# Patient Record
Sex: Male | Born: 1960 | Race: White | Hispanic: No | Smoking: Never smoker
Health system: Southern US, Community
[De-identification: ages and names within clinical notes are randomized; demographics above are authoritative.]

## PROBLEM LIST (undated history)

## (undated) DIAGNOSIS — K635 Polyp of colon: Secondary | ICD-10-CM

## (undated) DIAGNOSIS — I10 Essential (primary) hypertension: Secondary | ICD-10-CM

## (undated) HISTORY — DX: Polyp of colon: K63.5

---

## 2004-04-03 ENCOUNTER — Ambulatory Visit (HOSPITAL_COMMUNITY): Admission: RE | Admit: 2004-04-03 | Discharge: 2004-04-03 | Payer: Self-pay | Admitting: Gastroenterology

## 2004-06-20 ENCOUNTER — Encounter: Admission: RE | Admit: 2004-06-20 | Discharge: 2004-06-20 | Payer: Self-pay | Admitting: Family Medicine

## 2012-10-12 ENCOUNTER — Other Ambulatory Visit: Payer: Self-pay | Admitting: Gastroenterology

## 2015-01-22 ENCOUNTER — Other Ambulatory Visit (HOSPITAL_COMMUNITY): Payer: Self-pay

## 2015-01-22 ENCOUNTER — Ambulatory Visit
Admission: RE | Admit: 2015-01-22 | Discharge: 2015-01-22 | Disposition: A | Discharge status: Home / Self Care | Payer: BC Managed Care – PPO | Source: Ambulatory Visit | Attending: Physician Assistant | Admitting: Physician Assistant

## 2015-01-22 ENCOUNTER — Other Ambulatory Visit: Payer: Self-pay | Admitting: Physician Assistant

## 2015-01-22 DIAGNOSIS — R0789 Other chest pain: Secondary | ICD-10-CM

## 2015-01-22 DIAGNOSIS — R0602 Shortness of breath: Secondary | ICD-10-CM

## 2015-02-05 ENCOUNTER — Encounter: Payer: Self-pay | Admitting: Cardiology

## 2015-02-05 ENCOUNTER — Ambulatory Visit (INDEPENDENT_AMBULATORY_CARE_PROVIDER_SITE_OTHER): Payer: BC Managed Care – PPO | Admitting: Cardiology

## 2015-02-05 VITALS — BP 120/78 | HR 73 | Ht 70.0 in | Wt 228.0 lb

## 2015-02-05 DIAGNOSIS — R079 Chest pain, unspecified: Secondary | ICD-10-CM | POA: Insufficient documentation

## 2015-02-05 DIAGNOSIS — R0600 Dyspnea, unspecified: Secondary | ICD-10-CM

## 2015-02-05 DIAGNOSIS — R0789 Other chest pain: Secondary | ICD-10-CM

## 2015-02-05 DIAGNOSIS — R072 Precordial pain: Secondary | ICD-10-CM

## 2015-02-05 NOTE — Patient Instructions (Addendum)
Your physician recommends that you schedule a follow-up appointment in: as needed with Dr. Antoine PocheHochrein  We will make you an appt. With the vascular screening dept. over at our [address]  Location for vascular screening  We have put in for a ct calcium score at that same location

## 2015-02-05 NOTE — Progress Notes (Signed)
Cardiology Office Note   Date:  02/05/2015   ID:  Stanley Mahoney, DOB 02/20/1961, MRN [id number]  PCP:  No primary care provider on file.  Cardiologist:   Rollene Rotunda, MD   Chief Complaint  Patient presents with  . Shortness of Breath  . Chest Pain      History of Present Illness: Stanley Mahoney is a 54 y.o. male who presents for evaluation of chest pain and dyspnea. He has no past cardiac history. However, he reports that for a couple of months he's getting shortness of breath with activities such as walking up 5 flights of stairs. He says he can make it up the stairs but he will get short of breath at the top although he does recover. He does not have any shortness of breath at rest. He does occasionally get some chest discomfort. This is a mid chest pressure. It is somewhat reproducible when he exercises occasionally happens sporadically at rest. He does not get associated symptoms. He does not get radiation to his neck or to his arm.  He has had some shoulder discomfort but he thinks it's related to playing tennis recently.  He has not had any palpitations, presyncope or syncope. He denies any PND or orthopnea. He has had some soreness in his left thigh. He denies any weight gain or edema. He did have a treadmill test at [facility].  He says he went 10 minutes and was told it was normal although I don't have these results. He did have a BNP recently which was normal.   Past Medical History  Diagnosis Date  . Colon polyps     No past surgical history on file.   Current Outpatient Prescriptions  Medication Sig Dispense Refill  . diphenhydrAMINE (BENADRYL) 25 mg capsule Take 25 mg by mouth every 8 (eight) hours as needed.    Marland Kitchen EPIPEN 2-PAK 0.3 MG/0.3ML SOAJ injection See admin instructions. for allergic reaction  0  . esomeprazole (NEXIUM) 20 MG capsule Take 20 mg by mouth daily at 12 noon.    . fluticasone (FLONASE) 50 MCG/ACT nasal spray Place 1 spray into both  nostrils daily.     No current facility-administered medications for this visit.    Allergies:   Review of patient's allergies indicates not on file.    Social History:  The patient  reports that he has never smoked. He does not have any smokeless tobacco history on file.   Family History:  The patient's family history includes Aortic aneurysm in his maternal grandfather; CAD in an other family member; Colon cancer in his maternal grandmother; Heart disease in his maternal grandfather.    ROS:  Please see the history of present illness.   Otherwise, review of systems are positive for none.   All other systems are reviewed and negative.    PHYSICAL EXAM: VS:  BP 120/78 mmHg  Pulse 73  Ht  (1.778 m)  Wt 228 lb (103.42 kg)  BMI 32.71 kg/m2 , BMI Body mass index is 32.71 kg/(m^2). GENERAL:  Well appearing HEENT:  Pupils equal round and reactive, fundi not visualized, oral mucosa unremarkable NECK:  No jugular venous distention, waveform within normal limits, carotid upstroke brisk and symmetric, no bruits, no thyromegaly LYMPHATICS:  No cervical, inguinal adenopathy LUNGS:  Clear to auscultation bilaterally BACK:  No CVA tenderness CHEST:  Unremarkable HEART:  PMI not displaced or sustained,S1 and S2 within normal limits, no S3, no S4, no clicks,  no rubs, no murmurs ABD:  Flat, positive bowel sounds normal in frequency in pitch, no bruits, no rebound, no guarding, no midline pulsatile mass, no hepatomegaly, no splenomegaly EXT:  2 plus pulses throughout, no edema, no cyanosis no clubbing SKIN:  No rashes no nodules NEURO:  Cranial nerves II through XII grossly intact, motor grossly intact throughout PSYCH:  Cognitively intact, oriented to person place and time    EKG:  EKG is ordered today. The ekg ordered today demonstrates sinus rhythm, rate 73, axis within normal limits, short PR interval, no acute ST-T wave changes.      Wt Readings from Last 3 Encounters:  02/05/15  228 lb (103.42 kg)      Other studies Reviewed: Additional studies/ records that were reviewed today include: Office records. Review of the above records demonstrates:  Please see elsewhere in the note.     ASSESSMENT AND PLAN:  DYSPNEA:  I don't suspect an ischemic etiology. I think is very reasonable to do a coronary calcium score for risk stratification and on the outside possibility that he has a high score which would necessitate further ischemia evaluation.  He and I talked about this at length.   I think ordering pulmonary function tests would be reasonable. I will defer to Dr Clelia CroftShaw for this.  We then discussed the possibility of deconditioning. If he begins to exercise more routinely gets more short of breath rather than less I would want to reassess then possibly to exclude something like exercise-induced asthma. At that point cardiopulmonary stress testing might be more revealing.  ABNORMAL EKG:  He does have a short PR interval but I don't believe slight slurring in his QRS represents a delta wave. If there is ever any hint of dysrhythmia we could pursue an event monitor but he is not giving this history.    Current medicines are reviewed at length with the patient today.  The patient does not have concerns regarding medicines.  The following changes have been made:  no change  Labs/ tests ordered today include:  Orders Placed This Encounter  Procedures  . CT Cardiac Scoring  . EKG 12-Lead     Disposition:   FU with me as needed    Signed, Rollene RotundaJames Frutoso Dimare, MD  02/05/2015 6:12 PM     [facility]

## 2015-02-07 ENCOUNTER — Other Ambulatory Visit (HOSPITAL_COMMUNITY): Payer: Self-pay | Admitting: Radiology

## 2015-02-07 ENCOUNTER — Encounter: Payer: Self-pay | Admitting: Cardiology

## 2015-02-07 DIAGNOSIS — R0609 Other forms of dyspnea: Principal | ICD-10-CM

## 2015-02-08 ENCOUNTER — Ambulatory Visit (INDEPENDENT_AMBULATORY_CARE_PROVIDER_SITE_OTHER)
Admission: RE | Admit: 2015-02-08 | Discharge: 2015-02-08 | Disposition: A | Discharge status: Home / Self Care | Payer: BC Managed Care – PPO | Source: Ambulatory Visit | Attending: Cardiology | Admitting: Cardiology

## 2015-02-08 DIAGNOSIS — R0789 Other chest pain: Secondary | ICD-10-CM

## 2015-02-15 ENCOUNTER — Ambulatory Visit (HOSPITAL_COMMUNITY)
Admission: RE | Admit: 2015-02-15 | Discharge: 2015-02-15 | Disposition: A | Discharge status: Home / Self Care | Payer: BC Managed Care – PPO | Source: Ambulatory Visit | Attending: Internal Medicine | Admitting: Internal Medicine

## 2015-02-15 DIAGNOSIS — R0609 Other forms of dyspnea: Secondary | ICD-10-CM | POA: Diagnosis not present

## 2015-02-15 MED ORDER — ALBUTEROL SULFATE (2.5 MG/3ML) 0.083% IN NEBU
2.5000 mg | INHALATION_SOLUTION | Freq: Once | RESPIRATORY_TRACT | Status: AC
  Administered 2015-02-15: 2.5 mg via RESPIRATORY_TRACT

## 2015-02-17 LAB — PULMONARY FUNCTION TEST
DL/VA % PRED: 105 %
DL/VA: 4.88 ml/min/mmHg/L
DLCO UNC: 37.26 ml/min/mmHg
DLCO unc % pred: 115 %
FEF 25-75 POST: 4.24 L/s
FEF 25-75 Pre: 3.43 L/sec
FEF2575-%CHANGE-POST: 23 %
FEF2575-%PRED-PRE: 102 %
FEF2575-%Pred-Post: 126 %
FEV1-%CHANGE-POST: 3 %
FEV1-%PRED-POST: 108 %
FEV1-%PRED-PRE: 105 %
FEV1-POST: 4.19 L
FEV1-Pre: 4.06 L
FEV1FVC-%CHANGE-POST: 4 %
FEV1FVC-%Pred-Pre: 100 %
FEV6-%Change-Post: 0 %
FEV6-%PRED-PRE: 108 %
FEV6-%Pred-Post: 107 %
FEV6-PRE: 5.23 L
FEV6-Post: 5.18 L
FEV6FVC-%Change-Post: 0 %
FEV6FVC-%PRED-POST: 103 %
FEV6FVC-%Pred-Pre: 103 %
FVC-%Change-Post: -1 %
FVC-%PRED-POST: 103 %
FVC-%Pred-Pre: 105 %
FVC-PRE: 5.25 L
FVC-Post: 5.19 L
POST FEV1/FVC RATIO: 81 %
POST FEV6/FVC RATIO: 100 %
PRE FEV6/FVC RATIO: 100 %
Pre FEV1/FVC ratio: 77 %
RV % PRED: 116 %
RV: 2.43 L
TLC % PRED: 114 %
TLC: 7.99 L

## 2015-02-18 ENCOUNTER — Telehealth: Payer: Self-pay | Admitting: Cardiology

## 2015-02-18 ENCOUNTER — Encounter: Payer: Self-pay | Admitting: Cardiology

## 2015-02-18 ENCOUNTER — Other Ambulatory Visit: Payer: Self-pay | Admitting: *Deleted

## 2015-02-18 DIAGNOSIS — R931 Abnormal findings on diagnostic imaging of heart and coronary circulation: Secondary | ICD-10-CM

## 2015-02-18 NOTE — Telephone Encounter (Signed)
Pt is returning JC's call in reference to a test he had done. Please call back  thanks

## 2015-03-19 ENCOUNTER — Encounter (HOSPITAL_COMMUNITY): Payer: BC Managed Care – PPO

## 2015-05-17 ENCOUNTER — Encounter: Payer: Self-pay | Admitting: Cardiology

## 2015-05-17 ENCOUNTER — Ambulatory Visit (INDEPENDENT_AMBULATORY_CARE_PROVIDER_SITE_OTHER): Payer: BC Managed Care – PPO | Admitting: Cardiology

## 2015-05-17 VITALS — BP 128/90 | HR 71 | Ht 70.0 in | Wt 227.0 lb

## 2015-05-17 DIAGNOSIS — R0600 Dyspnea, unspecified: Secondary | ICD-10-CM

## 2015-05-17 NOTE — Patient Instructions (Addendum)
Your physician wants you to follow-up in: 1 Year You will receive a reminder letter in the mail two months in advance. If you don't receive a letter, please call our office to schedule the follow-up appointment.  Your physician has requested that you have an exercise tolerance test. For further information please visit [email or web address]. Please also follow instruction sheet, as given. 1 Year   

## 2015-05-17 NOTE — Progress Notes (Signed)
Cardiology Office Note   Date:  05/17/2015   ID:  Stanley Mahoney, DOB 01-20-1961, MRN [id number]  PCP:  [name], MD  Cardiologist:   Rollene Rotunda, MD   Chief Complaint  Patient presents with  . Follow-up    Patient has no complaints.      History of Present Illness: Stanley Mahoney is a 54 y.o. male who presents for evaluation of chest pain and dyspnea. This is described in the previous note.  This happens with extreme exertion. I did send him for a calcium score which was at the 70 percentile for his age but still very low at 81. Since then he's had no further cardiovascular symptoms.  The patient denies any new symptoms such as chest discomfort, neck or arm discomfort. There has been no new shortness of breath, PND or orthopnea. There have been no reported palpitations, presyncope or syncope.  He exercises routinely. He did have a negative exercise treadmill test in February and I was able to review this.    Past Medical History  Diagnosis Date  . Colon polyps     History reviewed. No pertinent past surgical history.   Current Outpatient Prescriptions  Medication Sig Dispense Refill  . Cetirizine HCl (ZYRTEC ALLERGY PO) Take by mouth as needed.    . diphenhydrAMINE (BENADRYL) 25 mg capsule Take 25 mg by mouth every 8 (eight) hours as needed.    Marland Kitchen EPIPEN 2-PAK 0.3 MG/0.3ML SOAJ injection See admin instructions. for allergic reaction  0  . esomeprazole (NEXIUM) 20 MG capsule Take 20 mg by mouth daily at 12 noon.    . fluticasone (FLONASE) 50 MCG/ACT nasal spray Place 1 spray into both nostrils daily.     No current facility-administered medications for this visit.    Allergies:   Review of patient's allergies indicates no known allergies.    ROS:  Please see the history of present illness.   Otherwise, review of systems are positive for none.   All other systems are reviewed and negative.    PHYSICAL EXAM: VS:  BP 128/90 mmHg  Pulse 71  Ht  (1.778 m)   Wt 227 lb (102.967 kg)  BMI 32.57 kg/m2 , BMI Body mass index is 32.57 kg/(m^2). GENERAL:  Well appearing NECK:  No jugular venous distention, waveform within normal limits, carotid upstroke brisk and symmetric, no bruits, no thyromegaly LUNGS:  Clear to auscultation bilaterally BACK:  No CVA tenderness CHEST:  Unremarkable HEART:  PMI not displaced or sustained,S1 and S2 within normal limits, no S3, no S4, no clicks, no rubs, no murmurs ABD:  Flat, positive bowel sounds normal in frequency in pitch, no bruits, no rebound, no guarding, no midline pulsatile mass, no hepatomegaly, no splenomegaly EXT:  2 plus pulses throughout, no edema, no cyanosis no clubbing   EKG:  EKG is not ordered today.     Wt Readings from Last 3 Encounters:  05/17/15 227 lb (102.967 kg)  02/05/15 228 lb (103.42 kg)      Other studies Reviewed: Additional studies/ records that were reviewed today include: Office records and a treadmill from [place].  I looked at his lipids.  Review of the above records demonstrates:  Please see elsewhere in the note.     ASSESSMENT AND PLAN:  CORONARY CALCIUM:   This calcium score with him at a more moderate brain 74th percentile for age but it was still low. I was able to review the stress test he had  at [facility]. He actually went for 11 minutes on his treadmill without any EKG changes. At the very peak gets shortness of breath and some mild discomfort he thinks that this was because he was breathing hard.  He has no other high-risk findings. At this point the possibility of obstructive coronary disease and even his risk stratification is low. I don't think further cardiovascular testing is suggested and we discussed this at length. He should have continued primary risk reduction and I will repeat a  POET (Plain Old Exercise Treadmill) in 1 year.  DYSLIPIDEMIA:    The patient will be sending his results to me and I will discuss further lipid management.   Current  medicines are reviewed at length with the patient today.  The patient does not have concerns regarding medicines.  The following changes have been made:  no change   Disposition:   FU with with a POET (Plain Old Exercise Treadmill) in one year.    Signed, Rollene Rotunda, MD  05/17/2015 8:53 AM    [facility]

## 2015-08-14 ENCOUNTER — Encounter: Payer: Self-pay | Admitting: Cardiology

## 2015-08-23 ENCOUNTER — Other Ambulatory Visit: Payer: Self-pay | Admitting: *Deleted

## 2015-08-23 DIAGNOSIS — I2510 Atherosclerotic heart disease of native coronary artery without angina pectoris: Secondary | ICD-10-CM

## 2015-08-23 DIAGNOSIS — R931 Abnormal findings on diagnostic imaging of heart and coronary circulation: Secondary | ICD-10-CM

## 2015-10-15 ENCOUNTER — Telehealth (HOSPITAL_COMMUNITY): Payer: Self-pay

## 2015-10-15 NOTE — Telephone Encounter (Signed)
Encounter complete. 

## 2015-10-16 ENCOUNTER — Ambulatory Visit (HOSPITAL_COMMUNITY)
Admission: RE | Admit: 2015-10-16 | Discharge: 2015-10-16 | Disposition: A | Discharge status: Home / Self Care | Payer: BC Managed Care – PPO | Source: Ambulatory Visit | Attending: Cardiovascular Disease | Admitting: Cardiovascular Disease

## 2015-10-16 DIAGNOSIS — I2510 Atherosclerotic heart disease of native coronary artery without angina pectoris: Secondary | ICD-10-CM | POA: Diagnosis not present

## 2015-10-16 DIAGNOSIS — R931 Abnormal findings on diagnostic imaging of heart and coronary circulation: Secondary | ICD-10-CM

## 2015-10-16 LAB — EXERCISE TOLERANCE TEST
CHL CUP MPHR: 167 {beats}/min
CSEPEW: 13.4 METS
CSEPHR: 99 %
CSEPPHR: 166 {beats}/min
Exercise duration (min): 11 min
RPE: 16
Rest HR: 78 {beats}/min

## 2016-06-03 ENCOUNTER — Other Ambulatory Visit: Payer: Self-pay | Admitting: Orthopedic Surgery

## 2016-06-03 DIAGNOSIS — M25512 Pain in left shoulder: Secondary | ICD-10-CM

## 2016-06-18 ENCOUNTER — Ambulatory Visit
Admission: RE | Admit: 2016-06-18 | Discharge: 2016-06-18 | Disposition: A | Discharge status: Home / Self Care | Payer: BC Managed Care – PPO | Source: Ambulatory Visit | Attending: Orthopedic Surgery | Admitting: Orthopedic Surgery

## 2016-06-18 DIAGNOSIS — M25512 Pain in left shoulder: Secondary | ICD-10-CM

## 2016-07-30 ENCOUNTER — Encounter: Payer: Self-pay | Admitting: Cardiology

## 2016-08-25 IMAGING — MR MR SHOULDER*L* W/O CM
4 of 5 series · 26 of 40 positions shown · non-contrast
Comparison: None.

CLINICAL DATA: Left shoulder pain for 8 months. No known injury.
Hurts to play tennis.

EXAM:
MRI OF THE LEFT SHOULDER WITHOUT CONTRAST
TECHNIQUE: Multiplanar, multisequence MR imaging of the shoulder was performed.
No intravenous contrast was administered.

[Series 3: T2 fat-sat · axial · 4.0mm · 0.55mm/px · z∈[-42,+43]mm · 8 of 20 slices shown (1 of 3)]
[im 1/20]
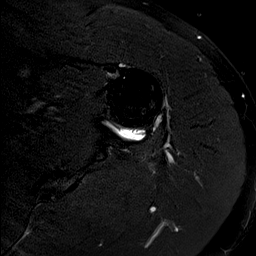
[im 3/20]
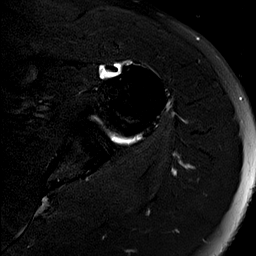
[im 6/20]
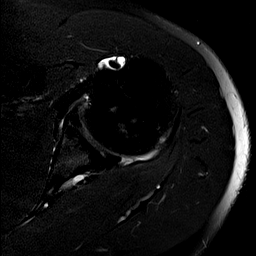
[im 9/20]
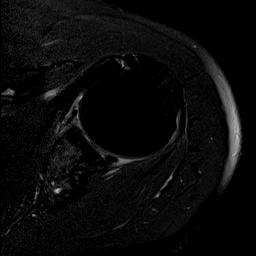
[im 11/20]
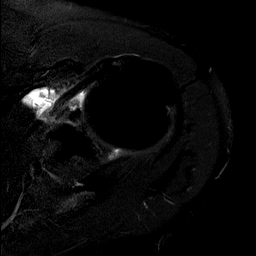
[im 14/20]
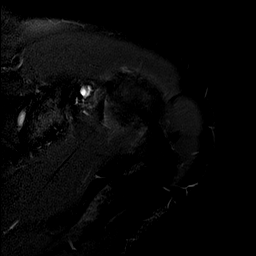
[im 17/20]
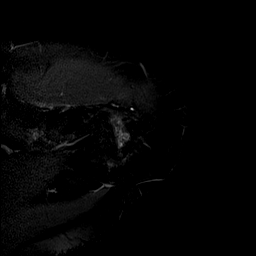
[im 20/20]
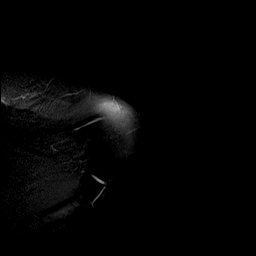

[Series 4: T2 fat-sat · oblique · 4.0mm · 0.55mm/px · 7 of 20 slices shown (2 of 3)]
[im 1/20]
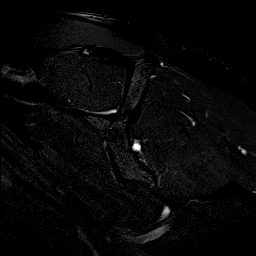
[im 3/20]
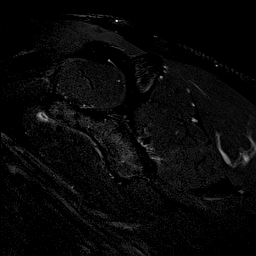
[im 6/20]
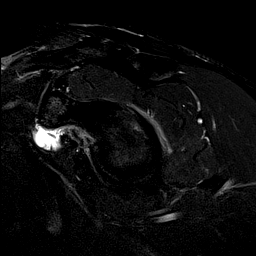
[im 9/20]
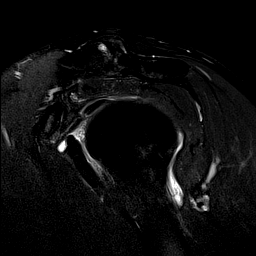
[im 11/20]
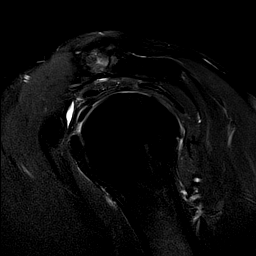
[im 14/20]
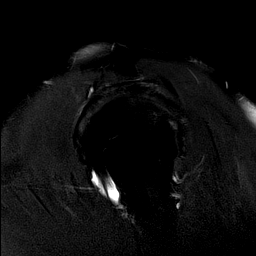
[im 17/20]
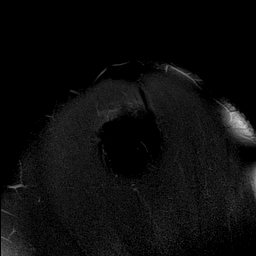

[Series 6: T2 fat-sat · oblique · 4.0mm · 0.27mm/px · 3 of 19 slices shown (3 of 3)]
[im 3/19]
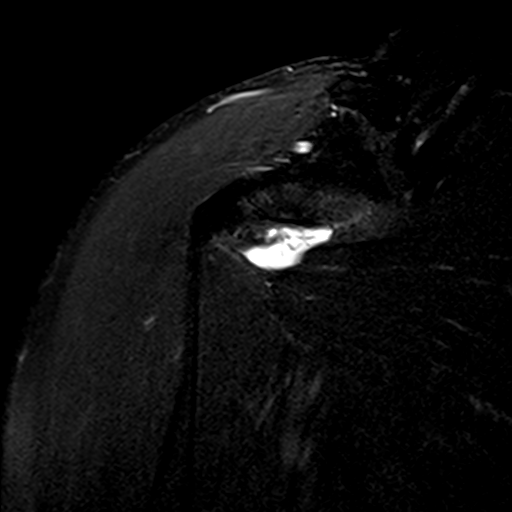
[im 11/19]
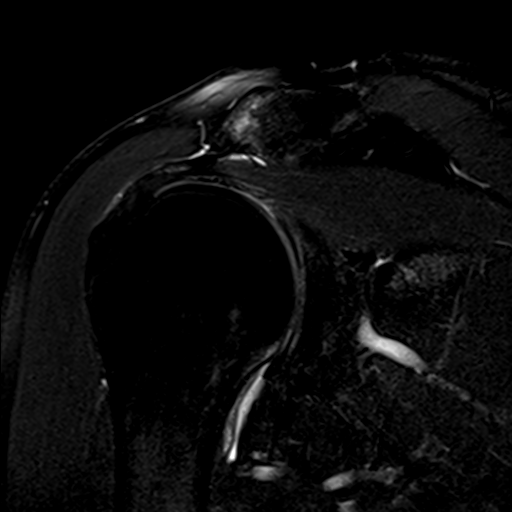
[im 16/19]
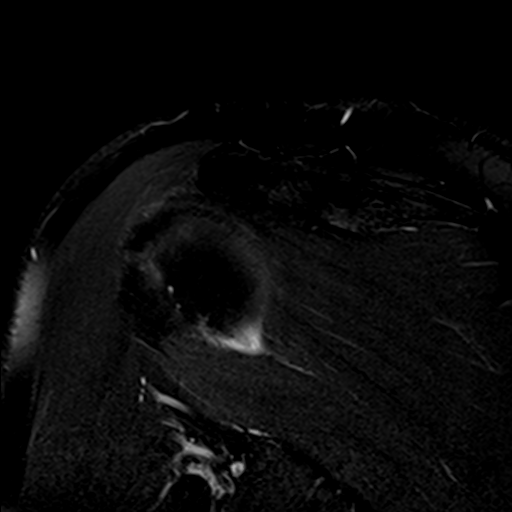

[Series 7: PD · oblique · 4.0mm · 0.22mm/px · 8 of 19 slices shown]
[im 1/19]
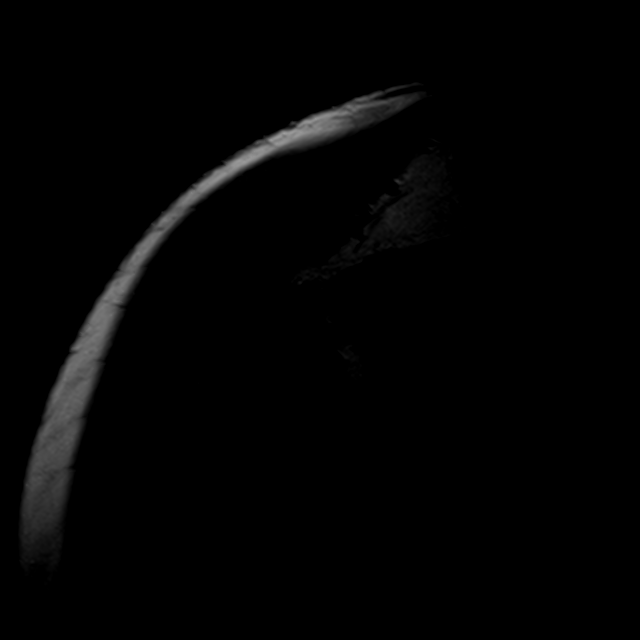
[im 3/19]
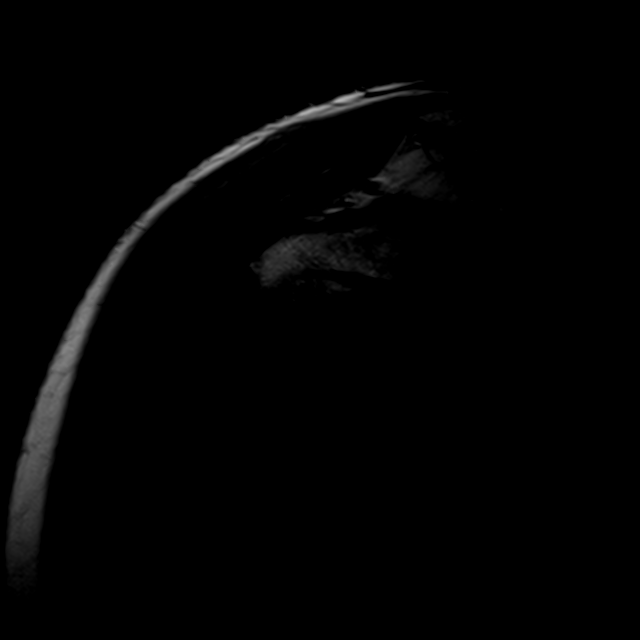
[im 6/19]
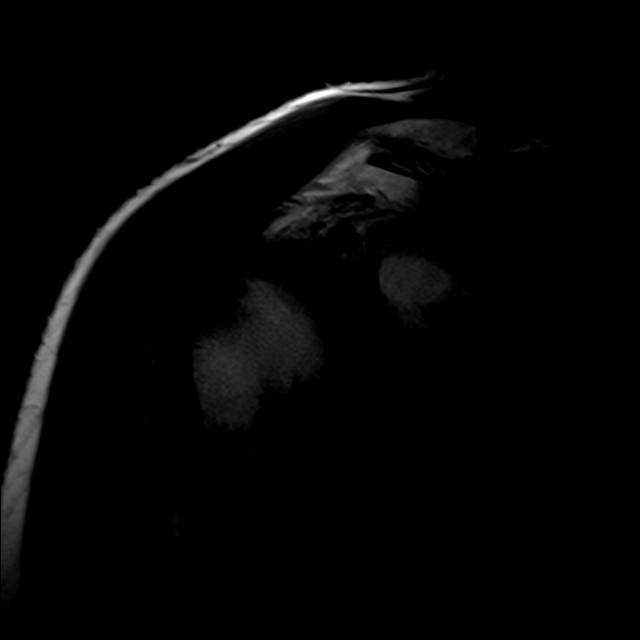
[im 8/19]
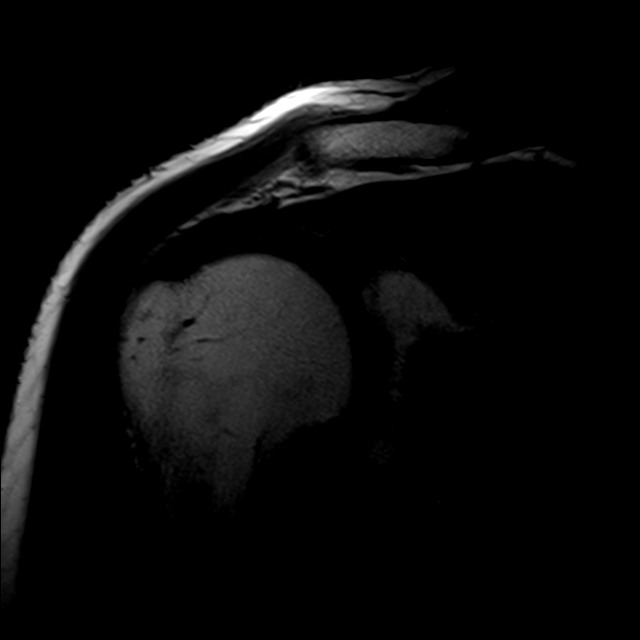
[im 11/19]
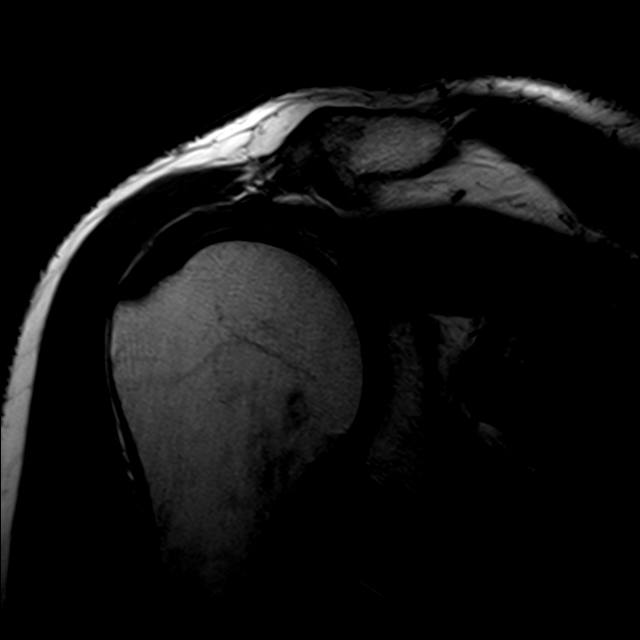
[im 13/19]
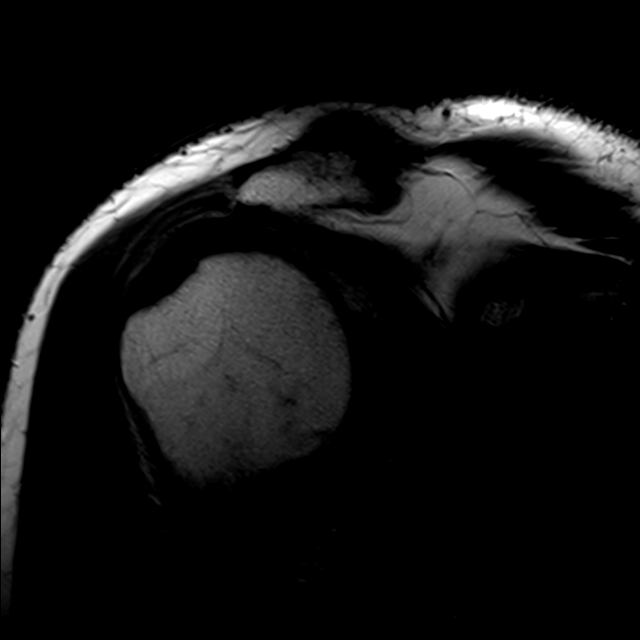
[im 16/19]
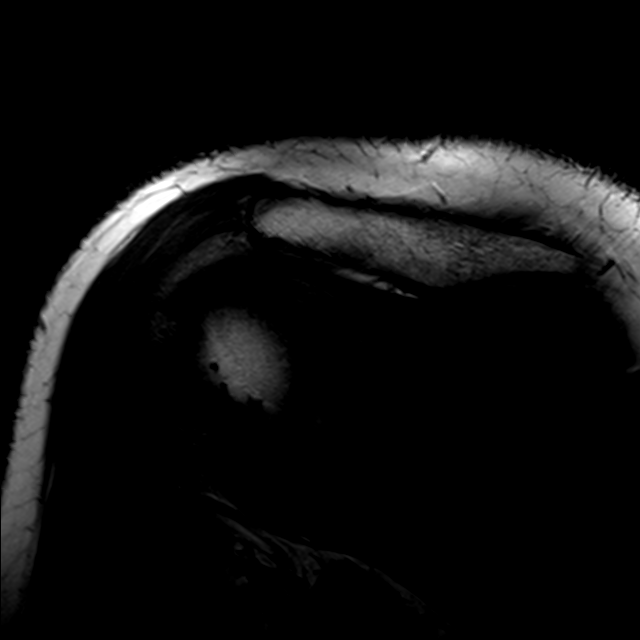
[im 19/19]
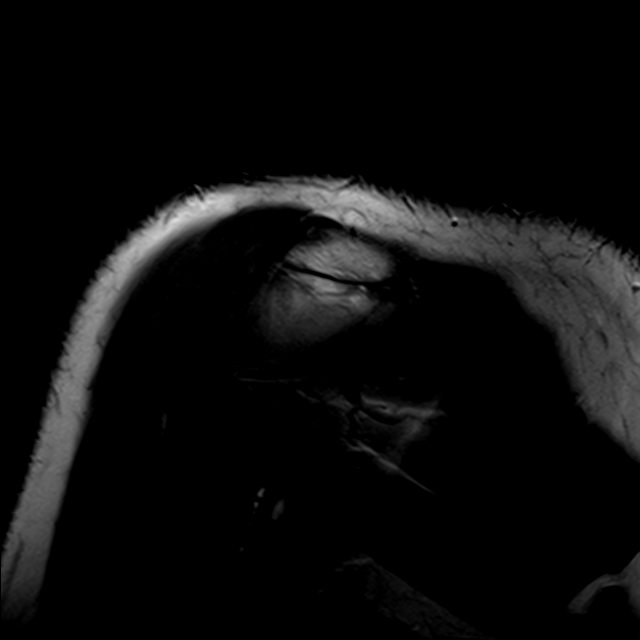

[26 of 40 positions shown; findings below may reference images not displayed]

FINDINGS: Rotator cuff: Moderate rotator cuff tendinopathy/ tendinosis with
areas of bursal and articular surface fraying and fibrillation. No
discrete partial or full-thickness tear.

Muscles:  Normal.

Biceps long head:  Intact.

Acromioclavicular Joint: Moderate AC joint degenerative changes with
stress edema and subchondral cysts. The acromion is relatively flat
or type 1 in shape. No significant lateral downsloping or
undersurface spurring.

Glenohumeral Joint: Mild glenohumeral joint degenerative changes.
Small joint effusion. Synovitis versus adhesive capsulitis.

Labrum: Superior labral degenerative changes with brain and probable
surface tears. No avulsion. Degenerative changes also involving the
anterior posterior labrum but no obvious tear.

Bones:  No acute bony findings.

Other: Mild subacromial/subdeltoid bursitis.
IMPRESSION: 1. Moderate rotator cuff tendinopathy/tendinosis but no partial or
full-thickness tear.
2. Intact long head biceps tendon.
3. Labral degenerative changes and possible surface tears.
4. Moderate AC joint degenerative changes but no other significant
findings for bony impingement.
5. Mild subacromial/subdeltoid bursitis.
6. Mild glenohumeral joint degenerative changes with small joint
effusion and synovitis versus adhesive capsulitis.

## 2016-11-13 ENCOUNTER — Telehealth: Payer: Self-pay | Admitting: Cardiology

## 2016-11-13 DIAGNOSIS — R931 Abnormal findings on diagnostic imaging of heart and coronary circulation: Secondary | ICD-10-CM

## 2016-11-13 DIAGNOSIS — R072 Precordial pain: Secondary | ICD-10-CM

## 2016-11-13 NOTE — Telephone Encounter (Signed)
Left message for patient, per last office visit, order placed for GXT and number left for patient to call and schedule.

## 2016-11-13 NOTE — Telephone Encounter (Signed)
NEW MESSAGE   Pt verbalized that he wants his yearly stress test

## 2016-11-17 ENCOUNTER — Telehealth (HOSPITAL_COMMUNITY): Payer: Self-pay

## 2016-11-17 NOTE — Telephone Encounter (Signed)
Encounter complete. 

## 2016-11-18 ENCOUNTER — Ambulatory Visit (HOSPITAL_COMMUNITY)
Admission: RE | Admit: 2016-11-18 | Discharge: 2016-11-18 | Disposition: A | Discharge status: Home / Self Care | Payer: BC Managed Care – PPO | Source: Ambulatory Visit | Attending: Cardiovascular Disease | Admitting: Cardiovascular Disease

## 2016-11-18 DIAGNOSIS — R931 Abnormal findings on diagnostic imaging of heart and coronary circulation: Secondary | ICD-10-CM | POA: Insufficient documentation

## 2016-11-18 LAB — EXERCISE TOLERANCE TEST
CHL CUP MPHR: 166 {beats}/min
CHL CUP RESTING HR STRESS: 62 {beats}/min
CHL RATE OF PERCEIVED EXERTION: 17
Estimated workload: 13.7 METS
Exercise duration (min): 12 min
Exercise duration (sec): 1 s
Peak HR: 153 {beats}/min
Percent HR: 92 %

## 2017-12-03 ENCOUNTER — Telehealth: Payer: Self-pay | Admitting: Cardiology

## 2017-12-03 NOTE — Telephone Encounter (Signed)
Returned call to patient of Dr. Antoine PocheHochrein last seen June 2016. He reports occasional heaviness and pain related to dietary choices - pain in esophageal region. He is easily exerted. He reports he has yearly stress tests - one in 2016, last in Dec 2017. He would like to have this test done again. Informed him I would need to consult with MD as there is no "standing order" for this test and advised him that MD may want to see him in the office for evaluation as well. He voiced understanding.

## 2017-12-03 NOTE — Telephone Encounter (Signed)
New Message  Pt call requesting to speak with RN about getting orders for an ETT. Please call back to discuss

## 2017-12-06 NOTE — Telephone Encounter (Signed)
Get him an appt with me next week please.  Thanks.

## 2017-12-08 NOTE — Telephone Encounter (Signed)
Patient called - scheduled for PAOV with Azalee CourseHao Meng Jan 3 @ 2pm

## 2017-12-09 ENCOUNTER — Encounter: Payer: Self-pay | Admitting: Physician Assistant

## 2017-12-09 ENCOUNTER — Ambulatory Visit: Payer: BC Managed Care – PPO | Admitting: Physician Assistant

## 2017-12-09 VITALS — BP 112/88 | HR 64 | Ht 70.0 in | Wt 233.0 lb

## 2017-12-09 DIAGNOSIS — R0602 Shortness of breath: Secondary | ICD-10-CM | POA: Diagnosis not present

## 2017-12-09 DIAGNOSIS — E785 Hyperlipidemia, unspecified: Secondary | ICD-10-CM | POA: Diagnosis not present

## 2017-12-09 DIAGNOSIS — R072 Precordial pain: Secondary | ICD-10-CM | POA: Diagnosis not present

## 2017-12-09 MED ORDER — METOPROLOL TARTRATE 50 MG PO TABS: ORAL_TABLET | ORAL | 0 refills | Status: DC

## 2017-12-09 NOTE — Progress Notes (Signed)
Cardiology Office Note    Date:  12/11/2017   ID:  Stanley Mahoney, DOB [birth year], MRN [id number]  PCP:  [name], MD  Cardiologist:  Dr. Antoine Poche   Chief Complaint  Patient presents with  . Follow-up    evaluation for chest pain for the past 6 month. Seen for Dr. Antoine Poche    History of Present Illness:  Stanley Mahoney is a 57 y.o. male with no significant past medical history who was previously evaluated by Dr. Antoine Poche in 2016 and also 2017 for chest pain.  He had a previous calcium score that was in the 70s percentile for his age but still low at 54.  He had a negative POET on 10/16/2015 and 11/18/2016.    Patient presents today for evaluation of chest discomfort.  He has chest pain both at rest and with exertion.  It is occurring 1-3 times on a weekly basis for the past 6 months.  According to the patient's report, multiple person in his paternal family had early CAD with a paternal uncle who had a early CAD in the 2s and 14s.  He is concerned about his chest discomfort.  He also has some bilateral shoulder pain as well.  So far he has had 2 plain old treadmill test for the past 2 years, we discussed several options, eventually we were agreeable to proceed with coronary CT with possible FFR in order to patient who is in his mid 82s and with family history of early CAD.  Of note, he is actually taking Lipitor 20 mg daily for hyperlipidemia.  He also has a history of hypertriglyceridemia as well.  Cardiac risk factor mainly associated with family history of early CAD, age, and hyperlipidemia. If coronary CT is negative, he can follow-up with cardiology only on as-needed basis.     Past Medical History:  Diagnosis Date  . Colon polyps     History reviewed. No pertinent surgical history.  Current Medications: Outpatient Medications Prior to Visit  Medication Sig Dispense Refill  . atorvastatin (LIPITOR) 20 MG tablet Take 1 tablet by mouth daily.  3  . Cetirizine HCl (ZYRTEC  ALLERGY PO) Take by mouth as needed.    . diphenhydrAMINE (BENADRYL) 25 mg capsule Take 25 mg by mouth every 8 (eight) hours as needed.    Marland Kitchen EPIPEN 2-PAK 0.3 MG/0.3ML SOAJ injection See admin instructions. for allergic reaction  0  . esomeprazole (NEXIUM) 20 MG capsule Take 20 mg by mouth daily at 12 noon.    . fluticasone (FLONASE) 50 MCG/ACT nasal spray Place 1 spray into both nostrils daily.    Marland Kitchen atorvastatin (LIPITOR) 20 MG tablet Take 1 tablet (20 mg total) by mouth daily. 90 tablet 3   No facility-administered medications prior to visit.      Allergies:   Patient has no known allergies.   Social History   Socioeconomic History  . Marital status: Married    Spouse name: None  . Number of children: 3  . Years of education: None  . Highest education level: None  Social Needs  . Financial resource strain: None  . Food insecurity - worry: None  . Food insecurity - inability: None  . Transportation needs - medical: None  . Transportation needs - non-medical: None  Occupational History  . Occupation: Clinical research associate  Tobacco Use  . Smoking status: Never Smoker  . Smokeless tobacco: Never Used  Substance and Sexual Activity  . Alcohol use: Yes    Alcohol/week:  0.0 oz    Comment: 5 glasses of wine per week  . Drug use: No  . Sexual activity: None  Other Topics Concern  . 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       Family History:  The patient's family history includes Aortic aneurysm in his maternal grandfather; CAD in his unknown relative; Colon cancer in his maternal grandmother; GER disease in his mother; Heart disease in his maternal grandfather; Hypertension in his father.   ROS:   Please see the history of present illness.    ROS All other systems reviewed and are negative.   PHYSICAL EXAM:   VS:  BP 112/88   Pulse 64   Ht 5\' 10"  (1.778 m)   Wt 233 lb (105.7 kg)   BMI 33.43 kg/m    GEN: Well nourished,  well developed, in no acute distress  HEENT: normal  Neck: no JVD, carotid bruits, or masses Cardiac: RRR; no murmurs, rubs, or gallops,no edema  Respiratory:  clear to auscultation bilaterally, normal work of breathing GI: soft, nontender, nondistended, + BS MS: no deformity or atrophy  Skin: warm and dry, no rash Neuro:  Alert and Oriented x 3, Strength and sensation are intact Psych: euthymic mood, full affect  Wt Readings from Last 3 Encounters:  12/09/17 233 lb (105.7 kg)  05/17/15 227 lb (103 kg)  02/05/15 228 lb (103.4 kg)      Studies/Labs Reviewed:   EKG:  EKG is ordered today.  The ekg ordered today demonstrates normal sinus rhythm without significant ST-T wave changes.  Recent Labs: No results found for requested labs within last 8760 hours.   Lipid Panel No results found for: CHOL, TRIG, HDL, CHOLHDL, VLDL, LDLCALC, LDLDIRECT  Additional studies/ records that were reviewed today include:   ETT 11/18/2016 Study Highlights     Blood pressure demonstrated a hypertensive response to exercise.  There was no ST segment deviation noted during stress.   Normal ETT at a good work load       ASSESSMENT:    1. Precordial pain   2. Shortness of breath   3. Hyperlipidemia, unspecified hyperlipidemia type      PLAN:  In order of problems listed above:  1. Precordial chest pain: It is occurring both at rest and with exertion, this has been ongoing for the past 6 months.  So far he has been having almost yearly plain old treadmill test.  He is very concerned about his family history of early CAD especially on the paternal side.  He is in his mid 65s and also have history of hyperlipidemia as well.  We discussed several options, eventually we agreed to proceed with coronary CT for further assessment  2. Hyperlipidemia: It is not documented under his medical record, however he is actually taking Lipitor 20 mg daily.    Medication Adjustments/Labs and Tests  Ordered: Current medicines are reviewed at length with the patient today.  Concerns regarding medicines are outlined above.  Medication changes, Labs and Tests ordered today are listed in the Patient Instructions below. Patient Instructions  Please arrive at the [facility] main entrance of [facility] at xx:xx AM (30-45 minutes prior to test start time)  [facility] [address] [place], [place] [address] [phone]  Proceed to the [facility] Radiology Department (First Floor).  Please follow these instructions carefully (unless otherwise directed):  Hold all erectile  dysfunction medications at least 48 hours prior to test.  On the Night Before the Test: . Drink plenty of water. . Do not consume any caffeinated/decaffeinated beverages or chocolate 12 hours prior to your test. . Do not take any antihistamines 12 hours prior to your test. . If you take Metformin do not take 24 hours prior to test. . If the patient has contrast allergy: ? Patient will need a prescription for Prednisone and very clear instructions (as follows): 1. Prednisone 50 mg - take 13 hours prior to test 2. Take another Prednisone 50 mg 7 hours prior to test 3. Take another Prednisone 50 mg 1 hour prior to test 4. Take Benadryl 50 mg 1 hour prior to test . Patient must complete all four doses of above prophylactic medications. . Patient will need a ride after test due to Benadryl.  On the Day of the Test: . Drink plenty of water. Do not drink any water within one hour of the test. . Do not eat any food 4 hours prior to the test. . You may take your regular medications prior to the test. . IF NOT ON A BETA BLOCKER - Take 50 mg of lopressor (metoprolol) one hour before the test. . HOLD Furosemide morning of the test.  After the Test: . Drink plenty of water. . After receiving IV contrast, you may experience a mild flushed feeling. This is normal. . On occasion, you may experience a  mild rash up to 24 hours after the test. This is not dangerous. If this occurs, you can take Benadryl 25 mg and increase your fluid intake. . If you experience trouble breathing, this can be serious. If it is severe call 911 IMMEDIATELY. If it is mild, please call our office. . If you take any of these medications: Glipizide/Metformin, Avandament, Glucavance, please do not take 48 hours after completing test.      Follow Up will depend on results    Ramond DialSigned, Ceasia Elwell, [place]PA  12/11/2017 9:46 AM    [facility] [address], [place], [place]  [address] Phone: [phone]; Fax: [phone]

## 2017-12-09 NOTE — Patient Instructions (Addendum)
Please arrive at the [facility] of [facility] at xx:xx AM (30-45 minutes prior to test start time)  [facility] [address] [place], [place] [address] [phone]  Proceed to the [facility] Radiology Department (First Floor).  Please follow these instructions carefully (unless otherwise directed):  Hold all erectile dysfunction medications at least 48 hours prior to test.  On the Night Before the Test: . Drink plenty of water. . Do not consume any caffeinated/decaffeinated beverages or chocolate 12 hours prior to your test. . Do not take any antihistamines 12 hours prior to your test. . If you take Metformin do not take 24 hours prior to test. . If the patient has contrast allergy: ? Patient will need a prescription for Prednisone and very clear instructions (as follows): 1. Prednisone 50 mg - take 13 hours prior to test 2. Take another Prednisone 50 mg 7 hours prior to test 3. Take another Prednisone 50 mg 1 hour prior to test 4. Take Benadryl 50 mg 1 hour prior to test . Patient must complete all four doses of above prophylactic medications. . Patient will need a ride after test due to Benadryl.  On the Day of the Test: . Drink plenty of water. Do not drink any water within one hour of the test. . Do not eat any food 4 hours prior to the test. . You may take your regular medications prior to the test. . IF NOT ON A BETA BLOCKER - Take 50 mg of lopressor (metoprolol) one hour before the test. . HOLD Furosemide morning of the test.  After the Test: . Drink plenty of water. . After receiving IV contrast, you may experience a mild flushed feeling. This is normal. . On occasion, you may experience a mild rash up to 24 hours after the test. This is not dangerous. If this occurs, you can take Benadryl 25 mg and increase your fluid intake. . If you experience trouble breathing, this can be serious. If it is severe call 911 IMMEDIATELY. If it  is mild, please call our office. . If you take any of these medications: Glipizide/Metformin, Avandament, Glucavance, please do not take 48 hours after completing test.      Follow Up will depend on results

## 2017-12-11 ENCOUNTER — Encounter: Payer: Self-pay | Admitting: Physician Assistant

## 2017-12-13 ENCOUNTER — Telehealth: Payer: Self-pay | Admitting: Physician Assistant

## 2017-12-13 NOTE — Telephone Encounter (Signed)
Left detailed message of price range from $1800 - $5000 on patient's voicemail.    Was given CPT codes [id number], [phone], [phone] and [id number] and advised to call his insurance company and give them codes and state will be done outpatient hospital so he could get info of how much they would cover depending on his plan.  Advised patient that we would precert with insurance company before the test would be scheduled  Gave patient my direct line incase he had any further questions

## 2018-01-10 ENCOUNTER — Encounter: Payer: Self-pay | Admitting: Physician Assistant

## 2018-01-24 ENCOUNTER — Ambulatory Visit (HOSPITAL_COMMUNITY)
Admission: RE | Admit: 2018-01-24 | Discharge: 2018-01-24 | Disposition: A | Discharge status: Home / Self Care | Payer: BC Managed Care – PPO | Source: Ambulatory Visit | Attending: Physician Assistant | Admitting: Physician Assistant

## 2018-01-24 DIAGNOSIS — R0602 Shortness of breath: Secondary | ICD-10-CM

## 2018-01-24 DIAGNOSIS — R079 Chest pain, unspecified: Secondary | ICD-10-CM | POA: Diagnosis not present

## 2018-01-24 DIAGNOSIS — R072 Precordial pain: Secondary | ICD-10-CM

## 2018-01-24 DIAGNOSIS — I2510 Atherosclerotic heart disease of native coronary artery without angina pectoris: Secondary | ICD-10-CM | POA: Insufficient documentation

## 2018-01-24 MED ORDER — NITROGLYCERIN 0.4 MG SL SUBL
0.8000 mg | SUBLINGUAL_TABLET | Freq: Once | SUBLINGUAL | Status: AC
  Administered 2018-01-24: 0.8 mg via SUBLINGUAL
  Filled 2018-01-24: qty 25

## 2018-01-24 MED ORDER — METOPROLOL TARTRATE 5 MG/5ML IV SOLN
INTRAVENOUS | Status: AC
  Filled 2018-01-24: qty 5

## 2018-01-24 MED ORDER — METOPROLOL TARTRATE 5 MG/5ML IV SOLN
5.0000 mg | Freq: Once | INTRAVENOUS | Status: AC
  Administered 2018-01-24: 5 mg via INTRAVENOUS
  Filled 2018-01-24: qty 5

## 2018-01-24 MED ORDER — NITROGLYCERIN 0.4 MG SL SUBL
SUBLINGUAL_TABLET | SUBLINGUAL | Status: AC
  Filled 2018-01-24: qty 2

## 2018-01-24 MED ORDER — IOPAMIDOL (ISOVUE-370) INJECTION 76%
INTRAVENOUS | Status: AC
  Administered 2018-01-24: 100 mL
  Filled 2018-01-24: qty 100

## 2018-01-25 ENCOUNTER — Telehealth: Payer: Self-pay | Admitting: Cardiology

## 2018-01-25 NOTE — Telephone Encounter (Signed)
Left message to make aware results need to be reviewed by PA.  Will call with results once reviewed.

## 2018-01-25 NOTE — Telephone Encounter (Signed)
Left message to call back for results

## 2018-01-25 NOTE — Telephone Encounter (Signed)
I have reviewed it 

## 2018-01-25 NOTE — Telephone Encounter (Signed)
New message  ° ° ° ° °Patient returning call for lab results  °

## 2018-01-25 NOTE — Telephone Encounter (Signed)
Returned call to patient coronary ct results given.

## 2018-01-25 NOTE — Progress Notes (Signed)
FFR report just resulted today. He does have mild disease in the heart, most significant disease is in the proximal section of left anterior descending artery. It is < 50% in degree, however FFR processing of the coronary disease suggest it is not significant enough to be fixed. Note, we usually do not fix any blockage < 75%. Continue lipitor. Followup with Dr. Antoine PocheHochrein in 1 year

## 2018-01-25 NOTE — Telephone Encounter (Signed)
New Message     Patient calling to get his CT results

## 2019-08-16 ENCOUNTER — Other Ambulatory Visit: Payer: Self-pay | Admitting: Internal Medicine

## 2019-08-16 ENCOUNTER — Ambulatory Visit
Admission: RE | Admit: 2019-08-16 | Discharge: 2019-08-16 | Disposition: A | Discharge status: Home / Self Care | Payer: BC Managed Care – PPO | Source: Ambulatory Visit | Attending: Internal Medicine | Admitting: Internal Medicine

## 2019-08-16 DIAGNOSIS — R1032 Left lower quadrant pain: Secondary | ICD-10-CM

## 2019-08-16 MED ORDER — IOPAMIDOL (ISOVUE-300) INJECTION 61%
125.0000 mL | Freq: Once | INTRAVENOUS | Status: AC | PRN
  Administered 2019-08-16: 125 mL via INTRAVENOUS

## 2020-02-08 ENCOUNTER — Other Ambulatory Visit: Payer: Self-pay

## 2020-02-08 ENCOUNTER — Ambulatory Visit: Payer: BC Managed Care – PPO | Attending: Internal Medicine

## 2020-02-08 DIAGNOSIS — Z23 Encounter for immunization: Secondary | ICD-10-CM | POA: Insufficient documentation

## 2020-02-08 NOTE — Progress Notes (Signed)
   Covid-19 Vaccination Clinic  Name:  KEYLEN ECKENRODE    MRN: [id number] DOB: Dec 27, 1960  02/08/2020  Mr. Craddock was observed post Covid-19 immunization for 15 minutes without incident. He was provided with Vaccine Information Sheet and instruction to access the V-Safe system.   Mr. Alen was instructed to call 911 with any severe reactions post vaccine: Marland Kitchen Difficulty breathing  . Swelling of face and throat  . A fast heartbeat  . A bad rash all over body  . Dizziness and weakness   Immunizations Administered    Name Date Dose VIS Date Route   Pfizer COVID-19 Vaccine 02/08/2020 11:46 AM 0.3 mL 11/17/2019 Intramuscular   Manufacturer: [employer], [employer]   Lot: [id number]   NDC: [address]-4

## 2020-02-21 ENCOUNTER — Ambulatory Visit: Payer: BC Managed Care – PPO | Attending: Internal Medicine

## 2020-02-21 DIAGNOSIS — Z20822 Contact with and (suspected) exposure to covid-19: Secondary | ICD-10-CM

## 2020-02-22 LAB — NOVEL CORONAVIRUS, NAA: SARS-CoV-2, NAA: NOT DETECTED

## 2020-02-29 ENCOUNTER — Ambulatory Visit: Payer: BC Managed Care – PPO | Attending: Internal Medicine

## 2020-02-29 DIAGNOSIS — Z23 Encounter for immunization: Secondary | ICD-10-CM

## 2020-02-29 NOTE — Progress Notes (Signed)
   Covid-19 Vaccination Clinic  Name:  Stanley Mahoney    MRN: [id number] DOB: 1961/04/30  02/29/2020  Mr. Stanley Mahoney was observed post Covid-19 immunization for 15 minutes without incident. He was provided with Vaccine Information Sheet and instruction to access the V-Safe system.   Mr. Stanley Mahoney was instructed to call 911 with any severe reactions post vaccine: Marland Kitchen Difficulty breathing  . Swelling of face and throat  . A fast heartbeat  . A bad rash all over body  . Dizziness and weakness   Immunizations Administered    Name Date Dose VIS Date Route   Pfizer COVID-19 Vaccine 02/29/2020 11:38 AM 0.3 mL 11/17/2019 Intramuscular   Manufacturer: [employer], [employer]   Lot: [id number]   NDC: [address]-4

## 2020-11-28 ENCOUNTER — Other Ambulatory Visit: Payer: BC Managed Care – PPO

## 2020-12-26 DIAGNOSIS — R931 Abnormal findings on diagnostic imaging of heart and coronary circulation: Secondary | ICD-10-CM | POA: Insufficient documentation

## 2020-12-26 NOTE — Progress Notes (Signed)
Cardiology Office Note   Date:  12/27/2020   ID:  Stanley Mahoney, DOB 07/26/1961, MRN [id number]  PCP:  No primary care provider on file.  Cardiologist:   Rollene Rotunda, MD   Chief Complaint  Patient presents with   Chest Pain      History of Present Illness: Stanley Mahoney is a 60 y.o. male who presents for evaluation of coronary calcium and SOB.    I saw him in 2017.  He had a negative POET (Plain Old Exercise Treadmill).   CT in 2019 demonstrated some mild LAD (less than 50%) plaque.   Calcium score was 273.    He reports that in December he was meeting the meeting.  He had dizziness.  He had some left arm and left leg numbness.  He had a hot flash.  He just did not feel bad the rest of that day.  He went home and actually took a COVID test which was negative.  His sensation passed in a few days.  However, he is also noticed that he has had some increased dyspnea on exertion.  He feels more short of breath walking upstairs.  It is intermittent.  He does not exercise routinely but he will carry groceries and sometimes he is short of breath at other times he is not.  He has some occasional chest discomfort across his chest but this is sporadic.  He can walk through the woods hunting and he does not necessarily bring the symptoms on.  He did have some high blood pressures recently.  He reports 130s over 100s.  He was started on olmesartan.    Past Medical History:  Diagnosis Date   Colon polyps     History reviewed. No pertinent surgical history.   Current Outpatient Medications  Medication Sig Dispense Refill   atorvastatin (LIPITOR) 20 MG tablet Take 1 tablet (20 mg total) by mouth daily. 90 tablet 3   Cetirizine HCl (ZYRTEC ALLERGY PO) Take by mouth as needed.     diphenhydrAMINE (BENADRYL) 25 mg capsule Take 25 mg by mouth every 8 (eight) hours as needed.     EPIPEN 2-PAK 0.3 MG/0.3ML SOAJ injection See admin instructions. for allergic reaction  0    esomeprazole (NEXIUM) 20 MG capsule Take 20 mg by mouth daily at 12 noon.     fluticasone (FLONASE) 50 MCG/ACT nasal spray Place 1 spray into both nostrils daily.     metoprolol tartrate (LOPRESSOR) 50 MG tablet Take 50 mg 1 hour before coronary ct 1 tablet 0   olmesartan (BENICAR) 20 MG tablet Take 20 mg by mouth daily.     No current facility-administered medications for this visit.    Allergies:   Patient has no known allergies.     ROS:  Please see the history of present illness.   Otherwise, review of systems are positive for none.   All other systems are reviewed and negative.    PHYSICAL EXAM: VS:  BP 112/80    Pulse 63    Ht 5\' 10"  (1.778 m)    Wt 226 lb (102.5 kg)    SpO2 98%    BMI 32.43 kg/m  , BMI Body mass index is 32.43 kg/m. GENERAL:  Well appearing HEENT:  Pupils equal round and reactive, fundi not visualized, oral mucosa unremarkable NECK:  No jugular venous distention, waveform within normal limits, carotid upstroke brisk and symmetric, possible bruits, no thyromegaly LYMPHATICS:  No cervical, inguinal adenopathy LUNGS:  Clear to auscultation bilaterally BACK:  No CVA tenderness CHEST:  Unremarkable HEART:  PMI not displaced or sustained,S1 and S2 within normal limits, no S3, no S4, no clicks, no rubs, no murmurs ABD:  Flat, positive bowel sounds normal in frequency in pitch, no bruits, no rebound, no guarding, no midline pulsatile mass, no hepatomegaly, no splenomegaly EXT:  2 plus pulses throughout, no edema, no cyanosis no clubbing SKIN:  No rashes no nodules NEURO:  Cranial nerves II through XII grossly intact, motor grossly intact throughout PSYCH:  Cognitively intact, oriented to person place and time    EKG:  EKG is ordered today. The ekg ordered today demonstrates sinus rhythm, rate 63, axis within normal limits, intervals within normal limits, no acute ST-T wave changes.   Recent Labs: No results found for requested labs within last 8760 hours.     Lipid Panel No results found for: CHOL, TRIG, HDL, CHOLHDL, VLDL, LDLCALC, LDLDIRECT    Wt Readings from Last 3 Encounters:  12/27/20 226 lb (102.5 kg)  12/09/17 233 lb (105.7 kg)  05/17/15 227 lb (103 kg)      Other studies Reviewed: Additional studies/ records that were reviewed today include: Labs, home rhythm strips. Review of the above records demonstrates:  Please see elsewhere in the note.     ASSESSMENT AND PLAN:  ELEVATED CORONARY CALCIUM SCORE: He has some dyspnea on exertion.  He also had known nonobstructive coronary disease. I will bring the patient back for a POET (Plain Old Exercise Test). This will allow me to screen for obstructive coronary disease, risk stratify and very importantly provide a prescription for exercise.  He will continue with primary risk reduction if this is normal.  SOB: This will be evaluated as above.  HTN: I encouraged him to continue the olmesartan.  BRUIT: I will check a carotid Doppler given this and the dizziness that he experienced.   Current medicines are reviewed at length with the patient today.  The patient does not have concerns regarding medicines.  The following changes have been made:  no change  Labs/ tests ordered today include:   Orders Placed This Encounter  Procedures   EXERCISE TOLERANCE TEST (ETT)   EKG 12-Lead   VAS US CAROTID     Disposition:   FU with me in two years or sooner based on the results of the testing above.   Signed, Rollene Rotunda, MD  12/27/2020 8:33 AM    [facility]

## 2020-12-27 ENCOUNTER — Encounter: Payer: Self-pay | Admitting: Cardiology

## 2020-12-27 ENCOUNTER — Other Ambulatory Visit: Payer: Self-pay

## 2020-12-27 ENCOUNTER — Ambulatory Visit: Payer: BC Managed Care – PPO | Admitting: Cardiology

## 2020-12-27 VITALS — BP 112/80 | HR 63 | Ht 70.0 in | Wt 226.0 lb

## 2020-12-27 DIAGNOSIS — R0602 Shortness of breath: Secondary | ICD-10-CM | POA: Diagnosis not present

## 2020-12-27 DIAGNOSIS — R0989 Other specified symptoms and signs involving the circulatory and respiratory systems: Secondary | ICD-10-CM | POA: Diagnosis not present

## 2020-12-27 DIAGNOSIS — R931 Abnormal findings on diagnostic imaging of heart and coronary circulation: Secondary | ICD-10-CM | POA: Diagnosis not present

## 2020-12-27 NOTE — Patient Instructions (Signed)
Medication Instructions:  No changes  Lab Work: None ordered this visit  Testing/Procedures: Your physician has requested that you have an exercise tolerance test. For further information please visit [email or web address] Please also follow instruction sheet, as given. You will need a Covid screening 3 days before the exercise tolerance test and quarantine until the procedure.   Your physician has requested that you have a carotid duplex. This test is an ultrasound of the carotid arteries in your neck. It looks at blood flow through these arteries that supply the brain with blood. Allow one hour for this exam. There are no restrictions or special instructions.  Follow-Up: At [facility], you and your health needs are our priority.  As part of our continuing mission to provide you with exceptional heart care, we have created designated Provider Care Teams.  These Care Teams include your primary Cardiologist (physician) and Advanced Practice Providers (APPs -  Physician Assistants and Nurse Practitioners) who all work together to provide you with the care you need, when you need it.  Your next appointment:   24 month(s)  You will receive a reminder letter in the mail two months in advance. If you don't receive a letter, please call our office to schedule the follow-up appointment.  The format for your next appointment:   In Person  Provider:   Rollene Rotunda, MD

## 2021-01-03 ENCOUNTER — Ambulatory Visit: Payer: BC Managed Care – PPO | Admitting: Cardiology

## 2021-01-06 ENCOUNTER — Other Ambulatory Visit (HOSPITAL_COMMUNITY)
Admission: RE | Admit: 2021-01-06 | Discharge: 2021-01-06 | Disposition: A | Discharge status: Home / Self Care | Payer: BC Managed Care – PPO | Source: Ambulatory Visit | Attending: Cardiology | Admitting: Cardiology

## 2021-01-06 DIAGNOSIS — Z01812 Encounter for preprocedural laboratory examination: Secondary | ICD-10-CM | POA: Diagnosis present

## 2021-01-06 DIAGNOSIS — Z20822 Contact with and (suspected) exposure to covid-19: Secondary | ICD-10-CM | POA: Diagnosis not present

## 2021-01-06 LAB — SARS CORONAVIRUS 2 (TAT 6-24 HRS): SARS Coronavirus 2: NEGATIVE

## 2021-01-09 ENCOUNTER — Ambulatory Visit (HOSPITAL_BASED_OUTPATIENT_CLINIC_OR_DEPARTMENT_OTHER)
Admission: RE | Admit: 2021-01-09 | Discharge: 2021-01-09 | Disposition: A | Discharge status: Home / Self Care | Payer: BC Managed Care – PPO | Source: Ambulatory Visit | Attending: Cardiology | Admitting: Cardiology

## 2021-01-09 ENCOUNTER — Other Ambulatory Visit: Payer: Self-pay

## 2021-01-09 ENCOUNTER — Other Ambulatory Visit (HOSPITAL_COMMUNITY): Payer: Self-pay | Admitting: Cardiology

## 2021-01-09 ENCOUNTER — Ambulatory Visit (HOSPITAL_COMMUNITY)
Admission: RE | Admit: 2021-01-09 | Discharge: 2021-01-09 | Disposition: A | Discharge status: Home / Self Care | Payer: BC Managed Care – PPO | Source: Ambulatory Visit | Attending: Cardiovascular Disease | Admitting: Cardiovascular Disease

## 2021-01-09 DIAGNOSIS — R0989 Other specified symptoms and signs involving the circulatory and respiratory systems: Secondary | ICD-10-CM | POA: Diagnosis not present

## 2021-01-09 DIAGNOSIS — R0602 Shortness of breath: Secondary | ICD-10-CM | POA: Diagnosis not present

## 2021-01-09 DIAGNOSIS — I6521 Occlusion and stenosis of right carotid artery: Secondary | ICD-10-CM

## 2021-01-09 DIAGNOSIS — I700 Atherosclerosis of aorta: Secondary | ICD-10-CM

## 2021-01-09 DIAGNOSIS — I6522 Occlusion and stenosis of left carotid artery: Secondary | ICD-10-CM

## 2021-01-09 LAB — EXERCISE TOLERANCE TEST
Estimated workload: 11.7 METS
Exercise duration (min): 10 min
Exercise duration (sec): 0 s
MPHR: 162 {beats}/min
Peak HR: 151 {beats}/min
Percent HR: 93 %
Rest HR: 71 {beats}/min

## 2021-01-27 ENCOUNTER — Ambulatory Visit (HOSPITAL_COMMUNITY)
Admission: RE | Admit: 2021-01-27 | Discharge: 2021-01-27 | Disposition: A | Discharge status: Home / Self Care | Payer: BC Managed Care – PPO | Source: Ambulatory Visit | Attending: Cardiovascular Disease | Admitting: Cardiovascular Disease

## 2021-01-27 ENCOUNTER — Other Ambulatory Visit: Payer: Self-pay

## 2021-01-27 DIAGNOSIS — I700 Atherosclerosis of aorta: Secondary | ICD-10-CM

## 2021-09-24 DIAGNOSIS — E785 Hyperlipidemia, unspecified: Secondary | ICD-10-CM | POA: Diagnosis not present

## 2021-09-24 DIAGNOSIS — Z125 Encounter for screening for malignant neoplasm of prostate: Secondary | ICD-10-CM | POA: Diagnosis not present

## 2021-10-01 DIAGNOSIS — Z1331 Encounter for screening for depression: Secondary | ICD-10-CM | POA: Diagnosis not present

## 2021-10-01 DIAGNOSIS — Z1389 Encounter for screening for other disorder: Secondary | ICD-10-CM | POA: Diagnosis not present

## 2021-10-01 DIAGNOSIS — I10 Essential (primary) hypertension: Secondary | ICD-10-CM | POA: Diagnosis not present

## 2021-10-01 DIAGNOSIS — Z0000 Encounter for general adult medical examination without abnormal findings: Secondary | ICD-10-CM | POA: Diagnosis not present

## 2021-10-01 DIAGNOSIS — Z23 Encounter for immunization: Secondary | ICD-10-CM | POA: Diagnosis not present

## 2021-12-29 DIAGNOSIS — F4323 Adjustment disorder with mixed anxiety and depressed mood: Secondary | ICD-10-CM | POA: Diagnosis not present

## 2022-01-09 ENCOUNTER — Ambulatory Visit (HOSPITAL_COMMUNITY)
Admission: RE | Admit: 2022-01-09 | Discharge: 2022-01-09 | Disposition: A | Discharge status: Home / Self Care | Payer: BC Managed Care – PPO | Source: Ambulatory Visit | Attending: Cardiology | Admitting: Cardiology

## 2022-01-09 ENCOUNTER — Other Ambulatory Visit: Payer: Self-pay

## 2022-01-09 DIAGNOSIS — I6523 Occlusion and stenosis of bilateral carotid arteries: Secondary | ICD-10-CM

## 2022-01-09 DIAGNOSIS — I6522 Occlusion and stenosis of left carotid artery: Secondary | ICD-10-CM | POA: Insufficient documentation

## 2022-01-09 DIAGNOSIS — I6521 Occlusion and stenosis of right carotid artery: Secondary | ICD-10-CM | POA: Insufficient documentation

## 2022-01-19 DIAGNOSIS — F4323 Adjustment disorder with mixed anxiety and depressed mood: Secondary | ICD-10-CM | POA: Diagnosis not present

## 2022-03-07 DIAGNOSIS — J019 Acute sinusitis, unspecified: Secondary | ICD-10-CM | POA: Diagnosis not present

## 2022-03-07 DIAGNOSIS — J020 Streptococcal pharyngitis: Secondary | ICD-10-CM | POA: Diagnosis not present

## 2022-03-09 DIAGNOSIS — Z1339 Encounter for screening examination for other mental health and behavioral disorders: Secondary | ICD-10-CM | POA: Diagnosis not present

## 2022-03-09 DIAGNOSIS — Z1331 Encounter for screening for depression: Secondary | ICD-10-CM | POA: Diagnosis not present

## 2022-03-09 DIAGNOSIS — K649 Unspecified hemorrhoids: Secondary | ICD-10-CM | POA: Diagnosis not present

## 2022-04-14 DIAGNOSIS — F4323 Adjustment disorder with mixed anxiety and depressed mood: Secondary | ICD-10-CM | POA: Diagnosis not present

## 2022-08-04 DIAGNOSIS — L812 Freckles: Secondary | ICD-10-CM | POA: Diagnosis not present

## 2022-08-04 DIAGNOSIS — L570 Actinic keratosis: Secondary | ICD-10-CM | POA: Diagnosis not present

## 2022-08-04 DIAGNOSIS — D225 Melanocytic nevi of trunk: Secondary | ICD-10-CM | POA: Diagnosis not present

## 2022-08-04 DIAGNOSIS — L821 Other seborrheic keratosis: Secondary | ICD-10-CM | POA: Diagnosis not present

## 2022-08-04 DIAGNOSIS — D1801 Hemangioma of skin and subcutaneous tissue: Secondary | ICD-10-CM | POA: Diagnosis not present

## 2022-08-07 DIAGNOSIS — F4323 Adjustment disorder with mixed anxiety and depressed mood: Secondary | ICD-10-CM | POA: Diagnosis not present

## 2022-08-20 DIAGNOSIS — J029 Acute pharyngitis, unspecified: Secondary | ICD-10-CM | POA: Diagnosis not present

## 2022-08-20 DIAGNOSIS — J018 Other acute sinusitis: Secondary | ICD-10-CM | POA: Diagnosis not present

## 2022-09-10 DIAGNOSIS — Z713 Dietary counseling and surveillance: Secondary | ICD-10-CM | POA: Diagnosis not present

## 2022-09-11 DIAGNOSIS — F4323 Adjustment disorder with mixed anxiety and depressed mood: Secondary | ICD-10-CM | POA: Diagnosis not present

## 2022-10-19 DIAGNOSIS — I10 Essential (primary) hypertension: Secondary | ICD-10-CM | POA: Diagnosis not present

## 2022-10-19 DIAGNOSIS — R82998 Other abnormal findings in urine: Secondary | ICD-10-CM | POA: Diagnosis not present

## 2022-10-19 DIAGNOSIS — E785 Hyperlipidemia, unspecified: Secondary | ICD-10-CM | POA: Diagnosis not present

## 2022-10-19 DIAGNOSIS — Z125 Encounter for screening for malignant neoplasm of prostate: Secondary | ICD-10-CM | POA: Diagnosis not present

## 2022-10-22 DIAGNOSIS — R7301 Impaired fasting glucose: Secondary | ICD-10-CM | POA: Diagnosis not present

## 2022-10-22 DIAGNOSIS — Z1339 Encounter for screening examination for other mental health and behavioral disorders: Secondary | ICD-10-CM | POA: Diagnosis not present

## 2022-10-22 DIAGNOSIS — I10 Essential (primary) hypertension: Secondary | ICD-10-CM | POA: Diagnosis not present

## 2022-10-22 DIAGNOSIS — Z0000 Encounter for general adult medical examination without abnormal findings: Secondary | ICD-10-CM | POA: Diagnosis not present

## 2022-10-22 DIAGNOSIS — Z1331 Encounter for screening for depression: Secondary | ICD-10-CM | POA: Diagnosis not present

## 2023-03-29 DIAGNOSIS — F4323 Adjustment disorder with mixed anxiety and depressed mood: Secondary | ICD-10-CM | POA: Diagnosis not present

## 2023-04-24 DIAGNOSIS — W57XXXA Bitten or stung by nonvenomous insect and other nonvenomous arthropods, initial encounter: Secondary | ICD-10-CM | POA: Diagnosis not present

## 2023-04-24 DIAGNOSIS — S40861A Insect bite (nonvenomous) of right upper arm, initial encounter: Secondary | ICD-10-CM | POA: Diagnosis not present

## 2023-06-18 DIAGNOSIS — F4323 Adjustment disorder with mixed anxiety and depressed mood: Secondary | ICD-10-CM | POA: Diagnosis not present

## 2023-07-06 DIAGNOSIS — R194 Change in bowel habit: Secondary | ICD-10-CM | POA: Diagnosis not present

## 2023-08-06 DIAGNOSIS — F4323 Adjustment disorder with mixed anxiety and depressed mood: Secondary | ICD-10-CM | POA: Diagnosis not present

## 2023-08-10 DIAGNOSIS — R197 Diarrhea, unspecified: Secondary | ICD-10-CM | POA: Diagnosis not present

## 2023-08-15 DIAGNOSIS — S91302A Unspecified open wound, left foot, initial encounter: Secondary | ICD-10-CM | POA: Diagnosis not present

## 2023-08-18 DIAGNOSIS — L812 Freckles: Secondary | ICD-10-CM | POA: Diagnosis not present

## 2023-08-18 DIAGNOSIS — L821 Other seborrheic keratosis: Secondary | ICD-10-CM | POA: Diagnosis not present

## 2023-08-23 DIAGNOSIS — I10 Essential (primary) hypertension: Secondary | ICD-10-CM | POA: Diagnosis not present

## 2023-09-29 NOTE — Progress Notes (Unsigned)
  Cardiology Office Note:   Date:  09/30/2023  ID:  Stanley Mahoney, DOB September 10, 1961, MRN [id number] PCP: Cleatis Polka., MD  [facility] Providers Cardiologist:  Rollene Rotunda, MD {  History of Present Illness:   Stanley Mahoney is a 62 y.o. male who presents for evaluation of coronary calcium and SOB.   I have not seen him since 2022.      I saw him in 2017.  He had a negative POET (Plain Old Exercise Treadmill).   CT in 2019 demonstrated some mild LAD (less than 50%) plaque.   Calcium score was 273.   He had a negative stress test in 2022.   He had dizziness in 2023 and he had a negative carotid Doppler.    Since I last saw him he has been to see a lifestyle medicine doctor in [place].  He had another calcium score and is was increased to a total of 1056 with 497 LAD, 143 circumflex and 97 in the RCA.  He has no symptoms.  He has been hunting and hiking and fishing.  The patient denies any new symptoms such as chest discomfort, neck or arm discomfort. There has been no new shortness of breath, PND or orthopnea. There have been no reported palpitations, presyncope or syncope.    ROS: As stated in the HPI and negative for all other systems.  Studies Reviewed:    EKG:   EKG Interpretation Date/Time:  Thursday September 30 2023 09:18:01 EDT Ventricular Rate:  64 PR Interval:  152 QRS Duration:  118 QT Interval:  394 QTC Calculation: 406 R Axis:   0  Text Interpretation: Normal sinus rhythm No significant change since last tracing Confirmed by Rollene Rotunda ([id number]) on 09/30/2023 9:50:37 AM     Risk Assessment/Calculations:              Physical Exam:   VS:  BP 122/82   Pulse 64   Ht 5\' 10"  (1.778 m)   Wt 184 lb 12.8 oz (83.8 kg)   SpO2 94%   BMI 26.52 kg/m    Wt Readings from Last 3 Encounters:  09/30/23 184 lb 12.8 oz (83.8 kg)  12/27/20 226 lb (102.5 kg)  12/09/17 233 lb (105.7 kg)     GEN: Well nourished, well developed in no acute distress NECK: No JVD;  No carotid bruits CARDIAC: RRR, no murmurs, rubs, gallops RESPIRATORY:  Clear to auscultation without rales, wheezing or rhonchi  ABDOMEN: Soft, non-tender, non-distended EXTREMITIES:  No edema; No deformity   ASSESSMENT AND PLAN:   ELEVATED CORONARY CALCIUM SCORE:   We have had another long discussion about this.  He has had increase in his calcium score but no symptoms.  I am going to bring him back and screen him with a POET (Plain Old Exercise Treadmill).  We are going to continue to pursue aggressive risk reduction.  See below.   HTN:   His blood pressure is well-controlled.  No change in therapy.  DYSLIPIDEMIA: I would like him to be on 40 mg of Lipitor as moderate dose despite the fact that his LDL is at target .  I will also measure an LPa       Follow up with me in 2 years.    Signed, Rollene Rotunda, MD

## 2023-09-30 ENCOUNTER — Encounter: Payer: Self-pay | Admitting: Cardiology

## 2023-09-30 ENCOUNTER — Ambulatory Visit: Payer: BC Managed Care – PPO | Attending: Cardiology | Admitting: Cardiology

## 2023-09-30 VITALS — BP 122/82 | HR 64 | Ht 70.0 in | Wt 184.8 lb

## 2023-09-30 DIAGNOSIS — R0602 Shortness of breath: Secondary | ICD-10-CM | POA: Diagnosis not present

## 2023-09-30 DIAGNOSIS — I10 Essential (primary) hypertension: Secondary | ICD-10-CM | POA: Diagnosis not present

## 2023-09-30 DIAGNOSIS — R931 Abnormal findings on diagnostic imaging of heart and coronary circulation: Secondary | ICD-10-CM

## 2023-09-30 MED ORDER — ATORVASTATIN CALCIUM 40 MG PO TABS: 40.0000 mg | ORAL_TABLET | Freq: Every day | ORAL | 11 refills | Status: DC

## 2023-09-30 NOTE — Patient Instructions (Signed)
Medication Instructions:  Lipitor has been increased to 40 mg daily new script sent to pharmacy. Continue to take one half of Benicar 20mg  daily. *If you need a refill on your cardiac medications before your next appointment, please call your pharmacy*   Lab Work: Lpa today. If you have labs (blood work) drawn today and your tests are completely normal, you will receive your results only by: MyChart Message (if you have MyChart) OR A paper copy in the mail If you have any lab test that is abnormal or we need to change your treatment, we will call you to review the results.   Testing/Procedures: Your physician has requested that you have an exercise tolerance test. For further information please visit [email or web address] Please also follow instruction sheet, as given. [address].   Follow-Up: At [facility], you and your health needs are our priority.  As part of our continuing mission to provide you with exceptional heart care, we have created designated Provider Care Teams.  These Care Teams include your primary Cardiologist (physician) and Advanced Practice Providers (APPs -  Physician Assistants and Nurse Practitioners) who all work together to provide you with the care you need, when you need it.  We recommend signing up for the patient portal called "MyChart".  Sign up information is provided on this After Visit Summary.  MyChart is used to connect with patients for Virtual Visits (Telemedicine).  Patients are able to view lab/test results, encounter notes, upcoming appointments, etc.  Non-urgent messages can be sent to your provider as well.   To learn more about what you can do with MyChart, go to [email or web address].    Your next appointment:   2 year(s)  Provider:   Rollene Rotunda, MD

## 2023-10-02 LAB — LIPOPROTEIN A (LPA): Lipoprotein (a): 49.4 nmol/L (ref <75.0)

## 2023-10-06 ENCOUNTER — Telehealth (HOSPITAL_COMMUNITY): Payer: Self-pay | Admitting: *Deleted

## 2023-10-06 NOTE — Telephone Encounter (Signed)
Spoke with patient and gave instruction for his ETT on 10/08/23 AT 3:30.

## 2023-10-08 ENCOUNTER — Ambulatory Visit (HOSPITAL_COMMUNITY): Payer: BC Managed Care – PPO | Attending: Cardiovascular Disease

## 2023-10-08 DIAGNOSIS — R931 Abnormal findings on diagnostic imaging of heart and coronary circulation: Secondary | ICD-10-CM

## 2023-10-08 LAB — EXERCISE TOLERANCE TEST
Angina Index: 0
Duke Treadmill Score: 14
Estimated workload: 17.2
Exercise duration (min): 14 min
Exercise duration (sec): 13 s
MPHR: 159 {beats}/min
Peak HR: 166 {beats}/min
Percent HR: 104 %
RPE: 18
Rest HR: 67 {beats}/min
ST Depression (mm): 0 mm

## 2023-10-18 DIAGNOSIS — Z860100 Personal history of colon polyps, unspecified: Secondary | ICD-10-CM | POA: Diagnosis not present

## 2023-10-18 DIAGNOSIS — Z83719 Family history of colon polyps, unspecified: Secondary | ICD-10-CM | POA: Diagnosis not present

## 2023-10-18 DIAGNOSIS — D128 Benign neoplasm of rectum: Secondary | ICD-10-CM | POA: Diagnosis not present

## 2023-10-18 DIAGNOSIS — Z09 Encounter for follow-up examination after completed treatment for conditions other than malignant neoplasm: Secondary | ICD-10-CM | POA: Diagnosis not present

## 2023-10-18 DIAGNOSIS — K5730 Diverticulosis of large intestine without perforation or abscess without bleeding: Secondary | ICD-10-CM | POA: Diagnosis not present

## 2023-10-18 DIAGNOSIS — Z800 Family history of malignant neoplasm of digestive organs: Secondary | ICD-10-CM | POA: Diagnosis not present

## 2023-11-09 DIAGNOSIS — R7301 Impaired fasting glucose: Secondary | ICD-10-CM | POA: Diagnosis not present

## 2023-11-09 DIAGNOSIS — Z125 Encounter for screening for malignant neoplasm of prostate: Secondary | ICD-10-CM | POA: Diagnosis not present

## 2023-11-09 DIAGNOSIS — E785 Hyperlipidemia, unspecified: Secondary | ICD-10-CM | POA: Diagnosis not present

## 2023-11-17 DIAGNOSIS — Z1331 Encounter for screening for depression: Secondary | ICD-10-CM | POA: Diagnosis not present

## 2023-11-17 DIAGNOSIS — Z23 Encounter for immunization: Secondary | ICD-10-CM | POA: Diagnosis not present

## 2023-11-17 DIAGNOSIS — R82998 Other abnormal findings in urine: Secondary | ICD-10-CM | POA: Diagnosis not present

## 2023-11-17 DIAGNOSIS — Z1339 Encounter for screening examination for other mental health and behavioral disorders: Secondary | ICD-10-CM | POA: Diagnosis not present

## 2023-11-17 DIAGNOSIS — I10 Essential (primary) hypertension: Secondary | ICD-10-CM | POA: Diagnosis not present

## 2023-11-17 DIAGNOSIS — Z0000 Encounter for general adult medical examination without abnormal findings: Secondary | ICD-10-CM | POA: Diagnosis not present

## 2023-12-21 DIAGNOSIS — M545 Low back pain, unspecified: Secondary | ICD-10-CM | POA: Diagnosis not present

## 2023-12-30 DIAGNOSIS — M25552 Pain in left hip: Secondary | ICD-10-CM | POA: Diagnosis not present

## 2024-01-18 ENCOUNTER — Encounter: Payer: Self-pay | Admitting: Cardiology

## 2024-01-20 ENCOUNTER — Other Ambulatory Visit: Payer: Self-pay

## 2024-01-20 MED ORDER — ROSUVASTATIN CALCIUM 20 MG PO TABS: 20.0000 mg | ORAL_TABLET | Freq: Every day | ORAL | 3 refills | Status: AC

## 2024-01-26 DIAGNOSIS — R29898 Other symptoms and signs involving the musculoskeletal system: Secondary | ICD-10-CM | POA: Diagnosis not present

## 2024-08-03 ENCOUNTER — Encounter: Payer: Self-pay | Admitting: Cardiology

## 2024-08-08 ENCOUNTER — Other Ambulatory Visit: Payer: Self-pay

## 2024-08-08 ENCOUNTER — Emergency Department (HOSPITAL_BASED_OUTPATIENT_CLINIC_OR_DEPARTMENT_OTHER)
Admission: EM | Admit: 2024-08-08 | Discharge: 2024-08-08 | Disposition: A | Discharge status: Home / Self Care | Attending: Emergency Medicine | Admitting: Emergency Medicine

## 2024-08-08 ENCOUNTER — Emergency Department (HOSPITAL_BASED_OUTPATIENT_CLINIC_OR_DEPARTMENT_OTHER): Admitting: Radiology

## 2024-08-08 ENCOUNTER — Encounter (HOSPITAL_BASED_OUTPATIENT_CLINIC_OR_DEPARTMENT_OTHER): Payer: Self-pay

## 2024-08-08 DIAGNOSIS — R002 Palpitations: Secondary | ICD-10-CM | POA: Diagnosis not present

## 2024-08-08 DIAGNOSIS — Z79899 Other long term (current) drug therapy: Secondary | ICD-10-CM | POA: Insufficient documentation

## 2024-08-08 DIAGNOSIS — I10 Essential (primary) hypertension: Secondary | ICD-10-CM | POA: Insufficient documentation

## 2024-08-08 DIAGNOSIS — E871 Hypo-osmolality and hyponatremia: Secondary | ICD-10-CM | POA: Insufficient documentation

## 2024-08-08 DIAGNOSIS — R079 Chest pain, unspecified: Secondary | ICD-10-CM | POA: Diagnosis not present

## 2024-08-08 DIAGNOSIS — R1013 Epigastric pain: Secondary | ICD-10-CM | POA: Diagnosis not present

## 2024-08-08 HISTORY — DX: Essential (primary) hypertension: I10

## 2024-08-08 LAB — CBC
HCT: 43.3 % (ref 39.0–52.0)
Hemoglobin: 15.1 g/dL (ref 13.0–17.0)
MCH: 31.4 pg (ref 26.0–34.0)
MCHC: 34.9 g/dL (ref 30.0–36.0)
MCV: 90 fL (ref 80.0–100.0)
Platelets: 303 K/uL (ref 150–400)
RBC: 4.81 MIL/uL (ref 4.22–5.81)
RDW: 11.9 % (ref 11.5–15.5)
WBC: 7.8 K/uL (ref 4.0–10.5)
nRBC: 0 % (ref 0.0–0.2)

## 2024-08-08 LAB — BASIC METABOLIC PANEL WITH GFR
Anion gap: 12 (ref 5–15)
BUN: 10 mg/dL (ref 8–23)
CO2: 24 mmol/L (ref 22–32)
Calcium: 9.6 mg/dL (ref 8.9–10.3)
Chloride: 97 mmol/L — ABNORMAL LOW (ref 98–111)
Creatinine, Ser: 0.97 mg/dL (ref 0.61–1.24)
GFR, Estimated: >60 mL/min (ref >60)
Glucose, Bld: 83 mg/dL (ref 70–99)
Potassium: 4.5 mmol/L (ref 3.5–5.1)
Sodium: 133 mmol/L — ABNORMAL LOW (ref 135–145)

## 2024-08-08 LAB — TROPONIN T, HIGH SENSITIVITY
Troponin T High Sensitivity: <15 ng/L (ref 0–19)
Troponin T High Sensitivity: <15 ng/L (ref 0–19)

## 2024-08-08 NOTE — ED Triage Notes (Signed)
 Reports 'racing heart beat' onset today, checked with home pulse ox and reported it went from 60-120 and bounced around quickly. Describes indigestion feeling, some tingling in hands, epigastric pain, bilateral arm pain. Took Xanax earlier w/ some relief. Scheduled for CCTA US . Under high stress, unsure if having anxiety attacks.

## 2024-08-08 NOTE — ED Provider Notes (Addendum)
  EMERGENCY DEPARTMENT AT [facility] Provider Note   CSN: [id number] Arrival date & time: 08/08/24  1540    Patient presents with: Palpitations   Stanley Mahoney is a 63 y.o. male w/ hx of anxiety, HTN that pf palpitations.    - Reports that he was having a very stressful day today. Then around 1pm, after lunch, he start to feel palpitations. This was accompanied by feeling of indigestions, tingling in both arms. - He checked his own BP cuff that is able to check EKG rhythm, and it told him he had an irregular rhythm 2 out of 4 times he checked. The highest HR was around 120. - He has prn xanax for anxiety, so he took this and it helped. - Symptoms lasted for about 10-15 minutes. However his HR would occasionally increase again, although not as high.  - Denies chest pain - He drank ginger ale. Burping helped his indigestion sensation - Denies recent medication changes, excessive caffeine intake, or other drug/substances. - wondered if he had a panic attack. Reports that he has never had a panic attack before.   Palpitations      Prior to Admission medications   Medication Sig Start Date End Date Taking? Authorizing Provider  ALPRAZolam (XANAX) 0.5 MG tablet Take 0.5 mg by mouth every 8 (eight) hours as needed.    [provider]  Cetirizine HCl (ZYRTEC ALLERGY PO) Take by mouth as needed.    [provider]  diphenhydrAMINE (BENADRYL) 25 mg capsule Take 25 mg by mouth every 8 (eight) hours as needed.    [provider]  EPIPEN 2-PAK 0.3 MG/0.3ML SOAJ injection See admin instructions. for allergic reaction 11/27/14   [provider]  esomeprazole (NEXIUM) 20 MG capsule Take 20 mg by mouth daily at 12 noon.    [provider]  fluticasone (FLONASE) 50 MCG/ACT nasal spray Place 1 spray into both nostrils daily.    [provider]  metoprolol  tartrate (LOPRESSOR ) 50 MG tablet Take 50 mg 1 hour before coronary ct  12/09/17   Meng, Hao, PA  olmesartan (BENICAR) 20 MG tablet Take 10 mg by mouth daily.    [provider]  rosuvastatin  (CRESTOR ) 20 MG tablet Take 1 tablet (20 mg total) by mouth daily. 01/20/24 04/19/24  Lavona Agent, MD    Allergies: Patient has no known allergies.    Review of Systems  Cardiovascular:  Positive for palpitations.    Updated Vital Signs BP (!) 148/102   Pulse 67   Temp 98.1 F (36.7 C) (Oral)   Resp 16   Ht 5' 10 (1.778 m)   Wt 87.5 kg   SpO2 93%   BMI 27.69 kg/m   Physical Exam General: Alert, pleasant man. NAD. HEENT: NCAT. MMM. CV: RRR, no murmurs.   Resp: CTAB, no wheezing or crackles. Normal WOB on RA.  Abm: Soft, nontender, nondistended. BS present. Ext: Moves all ext spontaneously Skin: Warm, well perfused   (all labs ordered are listed, but only abnormal results are displayed) Labs Reviewed  BASIC METABOLIC PANEL WITH GFR - Abnormal; Notable for the following components:      Result Value   Sodium 133 (*)    Chloride 97 (*)    All other components within normal limits  CBC  TROPONIN T, HIGH SENSITIVITY  TROPONIN T, HIGH SENSITIVITY    EKG: EKG Interpretation Date/Time:  Tuesday August 08 2024 15:56:49 EDT Ventricular Rate:  72 PR Interval:  144 QRS  Duration:  118 QT Interval:  382 QTC Calculation: 418 R Axis:   -18  Text Interpretation: Normal sinus rhythm Non-specific intra-ventricular conduction delay Nonspecific ST abnormality When compared with ECG of 30-Sep-2023 09:18, No significant change was found Confirmed by Doretha Folks ([id number]) on 08/08/2024 6:04:22 PM  Radiology: DG Chest 2 View Result Date: 08/08/2024 EXAM: 2 VIEW(S) XRAY OF THE CHEST 08/08/2024 05:28:00 PM COMPARISON: 01/22/2015 CLINICAL HISTORY: Chest pain. Reports 'racing heart beat' onset today, checked with home pulse ox and reported it went from 60-120 and bounced around quickly. Describes indigestion feeling, some tingling in hands, epigastric pain,  bilateral arm pain. Took Xanax earlier w/ some relief. Scheduled for CCTA US . Under high stress, unsure if having anxiety attacks. FINDINGS: LUNGS AND PLEURA: No focal pulmonary opacity. No pulmonary edema. No pleural effusion. No pneumothorax. HEART AND MEDIASTINUM: No acute abnormality of the cardiac and mediastinal silhouettes. BONES AND SOFT TISSUES: No acute osseous abnormality. IMPRESSION: 1. No acute process. Electronically signed by: Franky Stanford MD 08/08/2024 06:34 PM EDT RP Workstation: [id number]    Procedures   Medications Ordered in the ED - No data to display                             Medical Decision Making Amount and/or Complexity of Data Reviewed Labs: ordered. Radiology: ordered.   Medical Decision Making:   Stanley Mahoney is a 63 y.o. male who presented to the ED today with palpitations.  Complete initial physical exam performed. Reviewed and confirmed nursing documentation for past medical history, family history, social history.    Initial Assessment:   Differential includes panic attack, arrhythmias, electrolyte abnormalities, ACS. Given 10-24min duration of symptoms, increase life stressors, and improvement with xanax, most likely panic attack. Arrythmias and electrolyte abnormalities are less likely given stable EKG and labs. ACS also less likely given stable EKG.    Initial Plan:    Screening labs including CBC and Metabolic panel to evaluate for infectious or metabolic etiology of disease.    CXR to evaluate for structural/infectious intrathoracic pathology.  EKG to evaluate for cardiac pathology Objective evaluation as below reviewed   Initial Study Results:   Laboratory  All laboratory results reviewed without evidence of clinically relevant pathology.      EKG EKG was reviewed independently. Rate, rhythm, axis, intervals all examined and without medically relevant abnormality. ST segments without concerns for elevations.    Radiology:  All images  reviewed independently.  Agree with radiology report at this time.   DG Chest 2 View Result Date: 08/08/2024 EXAM: 2 VIEW(S) XRAY OF THE CHEST 08/08/2024 05:28:00 PM COMPARISON: 01/22/2015 CLINICAL HISTORY: Chest pain. Reports 'racing heart beat' onset today, checked with home pulse ox and reported it went from 60-120 and bounced around quickly. Describes indigestion feeling, some tingling in hands, epigastric pain, bilateral arm pain. Took Xanax earlier w/ some relief. Scheduled for CCTA US . Under high stress, unsure if having anxiety attacks. FINDINGS: LUNGS AND PLEURA: No focal pulmonary opacity. No pulmonary edema. No pleural effusion. No pneumothorax. HEART AND MEDIASTINUM: No acute abnormality of the cardiac and mediastinal silhouettes. BONES AND SOFT TISSUES: No acute osseous abnormality. IMPRESSION: 1. No acute process. Electronically signed by: Franky Stanford MD 08/08/2024 06:34 PM EDT RP Workstation: [id number]      Reassessment and Plan:   - VSS, trops neg x2, EKG unremarkable. Low c/f ACS.  - Pt is safe to discharge, with close PCP follow-up.  Final diagnoses:  Palpitations    ED Discharge Orders     None          Elicia Hamlet, MD 08/08/24 2124    Elicia Hamlet, MD 08/08/24 2126    Doretha Folks, MD 08/08/24 2148

## 2024-08-08 NOTE — Discharge Instructions (Signed)
 Please let your primary care doctor know about these symptoms and follow-up with him/her in the next week. You should also follow-up with your Cardiologist.

## 2024-08-21 DIAGNOSIS — H579 Unspecified disorder of eye and adnexa: Secondary | ICD-10-CM | POA: Diagnosis not present

## 2024-08-21 DIAGNOSIS — H5789 Other specified disorders of eye and adnexa: Secondary | ICD-10-CM | POA: Diagnosis not present

## 2024-08-23 DIAGNOSIS — S0502XA Injury of conjunctiva and corneal abrasion without foreign body, left eye, initial encounter: Secondary | ICD-10-CM | POA: Diagnosis not present

## 2024-08-30 DIAGNOSIS — H538 Other visual disturbances: Secondary | ICD-10-CM | POA: Diagnosis not present

## 2024-09-03 NOTE — Progress Notes (Unsigned)
 Virtual Visit via Video Note   Because of Lizzie Cokley Lasker's co-morbid illnesses, he is at least at moderate risk for complications without adequate follow up.  This format is felt to be most appropriate for this patient at this time.  All issues noted in this document were discussed and addressed.  A limited physical exam was performed with this format.  Please refer to the patient's chart for his consent to telehealth for [facility].   Date:  09/06/2024   ID:  Stanley Mahoney, DOB Jul 29, 1961, MRN  The patient was identified using 2 identifiers.  Patient Location: Other:    Provider Location: Home Office   PCP:  Loreli Elsie JONETTA Mickey., MD   [facility] Providers Cardiologist:  Lynwood Schilling, MD {  Evaluation Performed:  Follow-Up Visit  Chief Complaint:  Palpitations  History of Present Illness:    Stanley Mahoney is a 63 y.o. male with who presents for evaluation of coronary calcium  and SOB.  I saw him in 2017.  He had a negative POET (Plain Old Exercise Treadmill).   CT in 2019 demonstrated some mild LAD (less than 50%) plaque.   Calcium  score was 273.   He had a negative stress test in 2022.   He had dizziness in 2023 and he had a negative carotid Doppler.  He had another negative POET (Plain Old Exercise Treadmill) in Nov 2024.      Since I last saw him he was in the emergency room on 08/08/2024.  He had palpitations and diaphoresis.  Shortness of breath.  This happened at rest.  It might have been anxiety but there was no clear etiology.  He did go to the ED and I saw that he had negative troponins and no acute EKG changes.  Did not really have the symptoms before.  He does have known nonobstructive coronary disease.   Past Medical History:  Diagnosis Date   Colon polyps    Hypertension    No past surgical history on file.   Current Meds  Medication Sig   ALPRAZolam (XANAX) 0.5 MG tablet Take 0.5 mg by mouth every 8 (eight) hours as needed.   Cetirizine HCl  (ZYRTEC ALLERGY PO) Take by mouth as needed.   fluticasone (FLONASE) 50 MCG/ACT nasal spray Place 1 spray into both nostrils daily.   metoprolol  tartrate (LOPRESSOR ) 50 MG tablet Take 1 tablet (50 mg total) by mouth once for 1 dose. Take 90-120 minutes prior to scan. Hold for SBP less than 110.   olmesartan (BENICAR) 20 MG tablet Take 20 mg by mouth daily.   rosuvastatin  (CRESTOR ) 20 MG tablet Take 1 tablet (20 mg total) by mouth daily.     Allergies:   Patient has no known allergies.   Social History   Tobacco Use   Smoking status: Never   Smokeless tobacco: Never  Substance Use Topics   Alcohol  use: Yes    Alcohol /week: 0.0 standard drinks of alcohol     Comment: 5 glasses of wine per week   Drug use: No     Family Hx: The patient's family history includes Aortic aneurysm in his maternal grandfather; CAD in an other family member; CAD (age of onset: 40) in his mother; CAD (age of onset: 42) in his father; Colon cancer in his maternal grandmother; GER disease in his mother; Heart disease in his maternal grandfather; Hypertension in his father.  ROS:   Please see the history of present illness.  As stated in the HPI and negative for all other systems.   Prior CV studies:   The following studies were reviewed today: ED records   Labs/Other Tests and Data Reviewed:    EKG: Normal sinus rhythm, rate 72, axis within normal limits, intervals within normal limits, no acute ST-T wave changes, 08/08/2024  Recent Labs: 08/08/2024: BUN 10; Creatinine, Ser 0.97; Hemoglobin 15.1; Platelets 303; Potassium 4.5; Sodium 133   Recent Lipid Panel No results found for: CHOL, TRIG, HDL, CHOLHDL, LDLCALC, LDLDIRECT  Wt Readings from Last 3 Encounters:  09/06/24 190 lb (86.2 kg)  08/08/24 193 lb (87.5 kg)  09/30/23 184 lb 12.8 oz (83.8 kg)     Risk Assessment/Calculations:          Objective:    Vital Signs:  Pulse 71   Ht 5' 10 (1.778 m)   Wt 190 lb (86.2 kg)   BMI  27.26 kg/m    VITAL SIGNS:  reviewed GEN:  no acute distress EYES:  sclerae anicteric, EOMI - Extraocular Movements Intact PSYCH:  normal affect  ASSESSMENT & PLAN:    ELEVATED CORONARY CALCIUM  SCORE: The patient has extensive coronary disease with previous nonobstructive coronary disease.  He had no new unstable symptoms presenting to the ER.  The probability of obstructive coronary disease is at least moderately high.  Coronary CTA is indicated.  HTN:   Mildly elevated typically at home.  He is going to send me a blood pressure diary and I will likely need to adjust his medications   DYSLIPIDEMIA: He is going to send me his most recent lipid profile.  It looks like his LDL is 57.  His goal is in the 50s.F    Medication Adjustments/Labs and Tests Ordered: Current medicines are reviewed at length with the patient today.  Concerns regarding medicines are outlined above.   Tests Ordered: Orders Placed This Encounter  Procedures   CT CORONARY MORPH W/CTA COR W/SCORE W/CA W/CM &/OR WO/CM   Basic metabolic panel with GFR    Medication Changes: Meds ordered this encounter  Medications   metoprolol  tartrate (LOPRESSOR ) 50 MG tablet    Sig: Take 1 tablet (50 mg total) by mouth once for 1 dose. Take 90-120 minutes prior to scan. Hold for SBP less than 110.    Dispense:  1 tablet    Refill:  0    Follow Up:  In Person in one year.  Signed, Lynwood Schilling, MD  09/06/2024 12:56 PM    [facility]

## 2024-09-06 ENCOUNTER — Encounter: Payer: Self-pay | Admitting: Cardiology

## 2024-09-06 ENCOUNTER — Ambulatory Visit: Attending: Internal Medicine | Admitting: Cardiology

## 2024-09-06 VITALS — HR 71 | Ht 70.0 in | Wt 190.0 lb

## 2024-09-06 DIAGNOSIS — E785 Hyperlipidemia, unspecified: Secondary | ICD-10-CM

## 2024-09-06 DIAGNOSIS — R931 Abnormal findings on diagnostic imaging of heart and coronary circulation: Secondary | ICD-10-CM

## 2024-09-06 DIAGNOSIS — R079 Chest pain, unspecified: Secondary | ICD-10-CM

## 2024-09-06 DIAGNOSIS — I10 Essential (primary) hypertension: Secondary | ICD-10-CM | POA: Diagnosis not present

## 2024-09-06 DIAGNOSIS — Z01812 Encounter for preprocedural laboratory examination: Secondary | ICD-10-CM

## 2024-09-06 MED ORDER — METOPROLOL TARTRATE 50 MG PO TABS: 50.0000 mg | ORAL_TABLET | Freq: Once | ORAL | 0 refills | Status: AC

## 2024-09-06 NOTE — Progress Notes (Signed)
  Patient Consent for Virtual Visit        Stanley Mahoney has provided verbal consent on 09/06/2024 for a virtual visit (video or telephone).   CONSENT FOR VIRTUAL VISIT FOR:  Stanley Mahoney  By participating in this virtual visit I agree to the following:  I hereby voluntarily request, consent and authorize [facility] and its employed or contracted physicians, physician assistants, nurse practitioners or other licensed health care professionals (the Practitioner), to provide me with telemedicine health care services (the "Services) as deemed necessary by the treating Practitioner. I acknowledge and consent to receive the Services by the Practitioner via telemedicine. I understand that the telemedicine visit will involve communicating with the Practitioner through live audiovisual communication technology and the disclosure of certain medical information by electronic transmission. I acknowledge that I have been given the opportunity to request an in-person assessment or other available alternative prior to the telemedicine visit and am voluntarily participating in the telemedicine visit.  I understand that I have the right to withhold or withdraw my consent to the use of telemedicine in the course of my care at any time, without affecting my right to future care or treatment, and that the Practitioner or I may terminate the telemedicine visit at any time. I understand that I have the right to inspect all information obtained and/or recorded in the course of the telemedicine visit and may receive copies of available information for a reasonable fee.  I understand that some of the potential risks of receiving the Services via telemedicine include:  Delay or interruption in medical evaluation due to technological equipment failure or disruption; Information transmitted may not be sufficient (e.g. poor resolution of images) to allow for appropriate medical decision making by the Practitioner;  and/or  In rare instances, security protocols could fail, causing a breach of personal health information.  Furthermore, I acknowledge that it is my responsibility to provide information about my medical history, conditions and care that is complete and accurate to the best of my ability. I acknowledge that Practitioner's advice, recommendations, and/or decision may be based on factors not within their control, such as incomplete or inaccurate data provided by me or distortions of diagnostic images or specimens that may result from electronic transmissions. I understand that the practice of medicine is not an exact science and that Practitioner makes no warranties or guarantees regarding treatment outcomes. I acknowledge that a copy of this consent can be made available to me via my patient portal [facility] MyChart), or I can request a printed copy by calling the office of [facility].    I understand that my insurance will be billed for this visit.   I have read or had this consent read to me. I understand the contents of this consent, which adequately explains the benefits and risks of the Services being provided via telemedicine.  I have been provided ample opportunity to ask questions regarding this consent and the Services and have had my questions answered to my satisfaction. I give my informed consent for the services to be provided through the use of telemedicine in my medical care

## 2024-09-06 NOTE — Patient Instructions (Signed)
 Medication Instructions:  Your physician recommends that you continue on your current medications as directed. Please refer to the Current Medication list given to you today.  *If you need a refill on your cardiac medications before your next appointment, please call your pharmacy*  Lab Work: BMET within 30 days of CT once scheduled If you have labs (blood work) drawn today and your tests are completely normal, you will receive your results only by: MyChart Message (if you have MyChart) OR A paper copy in the mail If you have any lab test that is abnormal or we need to change your treatment, we will call you to review the results.  Testing/Procedures: Coronary CTA  Follow-Up: At [facility], you and your health needs are our priority.  As part of our continuing mission to provide you with exceptional heart care, our providers are all part of one team.  This team includes your primary Cardiologist (physician) and Advanced Practice Providers or APPs (Physician Assistants and Nurse Practitioners) who all work together to provide you with the care you need, when you need it.  Your next appointment:   1 year  Provider:   Lavona, MD  We recommend signing up for the patient portal called MyChart.  Sign up information is provided on this After Visit Summary.  MyChart is used to connect with patients for Virtual Visits (Telemedicine).  Patients are able to view lab/test results, encounter notes, upcoming appointments, etc.  Non-urgent messages can be sent to your provider as well.   To learn more about what you can do with MyChart, go to [email or web address].   Other Instructions   Your cardiac CT will be scheduled at one of the below locations:    [facility] [address]  [place], [place] [address] [phone]   If scheduled at [facility] please arrive at the [facility] (Entrance C2) of [facility] 30 minutes prior to test start time. You can use the FREE valet parking offered at entrance C (encouraged to control the heart rate for the test)  Proceed to the [facility] Radiology Department (first floor) to check-in and test prep.  All radiology patients and guests should use entrance C2 at [facility], accessed from [facility], even though the hospital's physical address listed is [address].  If scheduled at the Heart and Vascular Tower at [address], please enter the parking lot using the [address] entrance and use the FREE valet service at the patient drop-off area. Enter the building and check-in with registration on the main floor.  If scheduled at [facility], please arrive to the Heart and Vascular Center 15 mins early for check-in and test prep.  There is spacious parking and easy access to the radiology department from the [facility] Heart and Vascular entrance. Please enter here and check-in with the desk attendant.   If scheduled at [facility], please arrive 30 minutes early for check-in and test prep.  Please follow these instructions carefully (unless otherwise directed):  An IV will be required for this test and Nitroglycerin  will be given.  Hold all erectile dysfunction medications at least 3 days (72 hrs) prior to test. (Ie viagra, cialis, sildenafil, tadalafil, etc)   On the Night Before the Test: Be sure to Drink plenty of water. Do not consume any caffeinated/decaffeinated beverages or chocolate 12 hours prior to your test. Do not take any antihistamines  12 hours prior to your test.  On the Day of the Test: Drink plenty of water until 1 hour prior to the test. Do not eat any food 1 hour prior to test. You may take your regular medications prior to the test.  Take metoprolol  (Lopressor ) 50 mg two hours prior to test. If you take  Furosemide/Hydrochlorothiazide/Spironolactone/Chlorthalidone, please HOLD on the morning of the test. Patients who wear a continuous glucose monitor MUST remove the device prior to scanning. FEMALES- please wear underwire-free bra if available, avoid dresses & tight clothing  After the Test: Drink plenty of water. After receiving IV contrast, you may experience a mild flushed feeling. This is normal. On occasion, you may experience a mild rash up to 24 hours after the test. This is not dangerous. If this occurs, you can take Benadryl 25 mg, Zyrtec, Claritin, or Allegra and increase your fluid intake. (Patients taking Tikosyn should avoid Benadryl, and may take Zyrtec, Claritin, or Allegra) If you experience trouble breathing, this can be serious. If it is severe call 911 IMMEDIATELY. If it is mild, please call our office.  We will call to schedule your test 2-4 weeks out understanding that some insurance companies will need an authorization prior to the service being performed.   For more information and frequently asked questions, please visit our website : [email or web address]  For non-scheduling related questions, please contact the cardiac imaging nurse navigator should you have any questions/concerns: Cardiac Imaging Nurse Navigators Direct Office Dial: [phone]   For scheduling needs, including cancellations and rescheduling, please call [place], [phone].

## 2024-09-11 ENCOUNTER — Other Ambulatory Visit: Payer: Self-pay

## 2024-09-11 DIAGNOSIS — Z01812 Encounter for preprocedural laboratory examination: Secondary | ICD-10-CM

## 2024-09-11 NOTE — Telephone Encounter (Signed)
 [facility] called triage. Labs released

## 2024-09-12 LAB — BASIC METABOLIC PANEL WITH GFR
BUN/Creatinine Ratio: 12 (ref 10–24)
BUN: 12 mg/dL (ref 8–27)
CO2: 26 mmol/L (ref 20–29)
Calcium: 9.7 mg/dL (ref 8.6–10.2)
Chloride: 97 mmol/L (ref 96–106)
Creatinine, Ser: 0.98 mg/dL (ref 0.76–1.27)
Glucose: 107 mg/dL — ABNORMAL HIGH (ref 70–99)
Potassium: 4.7 mmol/L (ref 3.5–5.2)
Sodium: 135 mmol/L (ref 134–144)
eGFR: 88 mL/min/1.73 (ref >59)

## 2024-09-13 ENCOUNTER — Ambulatory Visit: Payer: Self-pay | Admitting: Cardiology

## 2024-09-14 ENCOUNTER — Ambulatory Visit (HOSPITAL_COMMUNITY)
Admission: RE | Admit: 2024-09-14 | Discharge: 2024-09-14 | Disposition: A | Discharge status: Home / Self Care | Source: Ambulatory Visit | Attending: Cardiology | Admitting: Cardiology

## 2024-09-14 ENCOUNTER — Ambulatory Visit (HOSPITAL_COMMUNITY): Admission: RE | Admit: 2024-09-14 | Source: Ambulatory Visit

## 2024-09-14 DIAGNOSIS — I2510 Atherosclerotic heart disease of native coronary artery without angina pectoris: Secondary | ICD-10-CM

## 2024-09-14 DIAGNOSIS — Q2545 Double aortic arch: Secondary | ICD-10-CM | POA: Diagnosis not present

## 2024-09-14 DIAGNOSIS — R079 Chest pain, unspecified: Secondary | ICD-10-CM | POA: Insufficient documentation

## 2024-09-14 DIAGNOSIS — R931 Abnormal findings on diagnostic imaging of heart and coronary circulation: Secondary | ICD-10-CM | POA: Diagnosis not present

## 2024-09-14 MED ORDER — IOHEXOL 350 MG/ML SOLN
100.0000 mL | Freq: Once | INTRAVENOUS | Status: AC | PRN
  Administered 2024-09-14: 100 mL via INTRAVENOUS

## 2024-09-14 MED ORDER — NITROGLYCERIN 0.4 MG SL SUBL
0.8000 mg | SUBLINGUAL_TABLET | Freq: Once | SUBLINGUAL | Status: AC
  Administered 2024-09-14: 0.8 mg via SUBLINGUAL

## 2024-09-15 ENCOUNTER — Other Ambulatory Visit: Payer: Self-pay | Admitting: Student in an Organized Health Care Education/Training Program

## 2024-09-15 ENCOUNTER — Ambulatory Visit (HOSPITAL_BASED_OUTPATIENT_CLINIC_OR_DEPARTMENT_OTHER)
Admission: RE | Admit: 2024-09-15 | Discharge: 2024-09-15 | Disposition: A | Discharge status: Home / Self Care | Source: Ambulatory Visit | Attending: Student in an Organized Health Care Education/Training Program | Admitting: Student in an Organized Health Care Education/Training Program

## 2024-09-15 DIAGNOSIS — R931 Abnormal findings on diagnostic imaging of heart and coronary circulation: Secondary | ICD-10-CM | POA: Diagnosis not present

## 2024-09-15 DIAGNOSIS — R079 Chest pain, unspecified: Secondary | ICD-10-CM | POA: Diagnosis not present

## 2024-09-15 NOTE — Progress Notes (Signed)
CT FFR ordered.  

## 2024-09-21 ENCOUNTER — Ambulatory Visit (HOSPITAL_COMMUNITY)

## 2024-09-26 DIAGNOSIS — L821 Other seborrheic keratosis: Secondary | ICD-10-CM | POA: Diagnosis not present

## 2024-09-26 DIAGNOSIS — D225 Melanocytic nevi of trunk: Secondary | ICD-10-CM | POA: Diagnosis not present

## 2024-09-26 DIAGNOSIS — L812 Freckles: Secondary | ICD-10-CM | POA: Diagnosis not present

## 2024-09-28 DIAGNOSIS — S61411A Laceration without foreign body of right hand, initial encounter: Secondary | ICD-10-CM | POA: Diagnosis not present

## 2024-09-28 DIAGNOSIS — W260XXA Contact with knife, initial encounter: Secondary | ICD-10-CM | POA: Diagnosis not present

## 2024-09-28 DIAGNOSIS — H903 Sensorineural hearing loss, bilateral: Secondary | ICD-10-CM | POA: Diagnosis not present

## 2024-09-28 NOTE — Progress Notes (Signed)
 " Subjective Patient ID: Stanley Mahoney is a 63 y.o. male.  Chief Complaint  Patient presents with   Laceration    Slicing avocado this am and knife slipped cutting left pointer finger    The following information was reviewed by members of the visit team:  Tobacco  Allergies  Meds  Med Hx  Surg Hx  Fam Hx  Soc Hx     HPI  Patient is a 63 year old who presents with a laceration to his right digit.  Patient cut it on a knife when he was cutting avocado this morning.  He is up-to-date on his tetanus.  Patient is able to move digit.  Review of Systems  Constitutional: Negative.   HENT: Negative.    Eyes:  Negative for visual disturbance.  Respiratory: Negative.    Cardiovascular: Negative.   Gastrointestinal: Negative.   Genitourinary:  Negative for dysuria.  Musculoskeletal: Negative.   Skin:  Positive for wound.  Neurological:  Negative for light-headedness and headaches.  Psychiatric/Behavioral: Negative.      Objective Physical Exam Constitutional:      Appearance: Normal appearance.  HENT:     Head: Normocephalic and atraumatic.     Nose: Nose normal.     Mouth/Throat:     Mouth: Mucous membranes are moist.  Eyes:     Conjunctiva/sclera: Conjunctivae normal.  Cardiovascular:     Rate and Rhythm: Normal rate and regular rhythm.  Pulmonary:     Effort: Pulmonary effort is normal.     Breath sounds: Normal breath sounds.  Musculoskeletal:        General: Normal range of motion.  Skin:    General: Skin is warm and dry.     Comments: Patient has a 2.5 cm laceration to the lateral aspect of the right second digit.  This extends to subcu.  No tendon involvement.  It is at the level of the PIP.  No foreign body seen on close visual inspection.  He is able to flex and extend the digit without difficulty.  Cap refill is less than 2 seconds.  Neurological:     General: No focal deficit present.     Mental Status: He is alert and oriented to person, place, and time.   Psychiatric:        Mood and Affect: Mood normal.     Assessment/Plan Diagnoses and all orders for this visit:  Laceration of right hand without foreign body, initial encounter -     XR Fingers Minimum 2 Views Right  Other orders -     fluticasone propionate (FLONASE) 50 mcg/spray nasal spray; Administer 1 spray into each nostril daily.   Laceration repair  Date/Time: 09/28/2024 9:30 AM  Performed by: Lorane Esau Bob, PA-C Authorized by: Lorane Esau Bob, PA-C   Consent:    Consent obtained:  Verbal   Consent given by:  Patient   Risks discussed:  Infection, pain and poor cosmetic result Universal protocol:    Procedure explained and questions answered to patient or proxy's satisfaction: yes     Relevant documents present and verified: yes     Test results available: yes     Imaging studies available: yes     Required blood products, implants, devices, and special equipment available: yes     Patient identity confirmed:  Verbally with patient Anesthesia:    Anesthesia method:  None Laceration details:    Location:  Finger   Finger location:  L index finger   Length (cm):  2   Depth (mm):  4 Pre-procedure details:    Preparation:  Patient was prepped and draped in usual sterile fashion and imaging obtained to evaluate for foreign bodies Exploration:    Limited defect created (wound extended): no     Imaging obtained: x-ray     Imaging outcome: foreign body not noted     Wound exploration: wound explored through full range of motion and entire depth of wound visualized     Contaminated: no   Treatment:    Area cleansed with:  Shur-Clens   Amount of cleaning:  Standard   Irrigation solution:  Sterile water   Irrigation volume:  200   Visualized foreign bodies/material removed: no     Debridement:  None   Undermining:  None   Scar revision: no   Skin repair:    Repair method:  Sutures   Suture size:  4-0   Suture material:  Nylon   Suture  technique:  Simple interrupted   Number of sutures:  5 Approximation:    Approximation:  Close Repair type:    Repair type:  Simple Post-procedure details:    Dressing:  Antibiotic ointment, non-adherent dressing and adhesive bandage   Procedure completion:  Tolerated well, no immediate complications    Pleasant 63 year old male presents with a laceration to his right second digit.  Patient cut his digit on a knife while removing a stone from avocado.  Pt Is up-to-date on his tetanus.  Diff diagnosis: Tendon laceration, open fracture, superficial laceration, subcutaneous laceration.  Pt On exam had no findings suggestive of tendon laceration.  No foreign bodies in the wound.  Sensation was intact.  I was able to control bleeding.  I repaired the wound with 5 simple interrupted sutures.  Patient tolerated this procedure very well.  He was given wound care education.  He will have his sutures out in 7 to 10 days.  He was given wound care education.  He was given written and verbal instructions.  He will follow-up with his PCP.  Patient was stable at time of discharge.  Urgent Care Disposition:  Follow up with PCP   Electronically signed: Lorane Esau Bob, PA-C 09/28/2024  10:35 AM   "

## 2024-10-09 DIAGNOSIS — Z4802 Encounter for removal of sutures: Secondary | ICD-10-CM | POA: Diagnosis not present
